# Patient Record
Sex: Male | Born: 1940 | Race: White | Hispanic: No | Marital: Married | Smoking: Current every day smoker
Health system: Southern US, Community
[De-identification: ages and names within clinical notes are randomized; demographics above are authoritative.]

## PROBLEM LIST (undated history)

## (undated) DIAGNOSIS — I10 Essential (primary) hypertension: Secondary | ICD-10-CM

## (undated) DIAGNOSIS — W3400XA Accidental discharge from unspecified firearms or gun, initial encounter: Secondary | ICD-10-CM

## (undated) DIAGNOSIS — R569 Unspecified convulsions: Secondary | ICD-10-CM

## (undated) DIAGNOSIS — E785 Hyperlipidemia, unspecified: Secondary | ICD-10-CM

## (undated) DIAGNOSIS — J449 Chronic obstructive pulmonary disease, unspecified: Secondary | ICD-10-CM

## (undated) DIAGNOSIS — K219 Gastro-esophageal reflux disease without esophagitis: Secondary | ICD-10-CM

## (undated) DIAGNOSIS — F32A Depression, unspecified: Secondary | ICD-10-CM

## (undated) DIAGNOSIS — I639 Cerebral infarction, unspecified: Secondary | ICD-10-CM

## (undated) DIAGNOSIS — F329 Major depressive disorder, single episode, unspecified: Secondary | ICD-10-CM

## (undated) HISTORY — PX: APPENDECTOMY: SHX54

## (undated) HISTORY — PX: LAMINECTOMY: SHX219

## (undated) HISTORY — PX: BACK SURGERY: SHX140

---

## 2005-11-05 ENCOUNTER — Observation Stay: Payer: Self-pay | Admitting: General Surgery

## 2005-11-05 ENCOUNTER — Other Ambulatory Visit: Payer: Self-pay

## 2008-05-25 ENCOUNTER — Ambulatory Visit: Payer: Self-pay | Admitting: Internal Medicine

## 2008-07-06 ENCOUNTER — Ambulatory Visit: Payer: Self-pay | Admitting: Internal Medicine

## 2008-07-27 ENCOUNTER — Ambulatory Visit: Payer: Self-pay | Admitting: Family Medicine

## 2008-09-06 ENCOUNTER — Emergency Department: Payer: Self-pay | Admitting: Emergency Medicine

## 2008-10-08 ENCOUNTER — Emergency Department: Payer: Self-pay | Admitting: Emergency Medicine

## 2010-06-25 ENCOUNTER — Inpatient Hospital Stay: Payer: Self-pay | Admitting: *Deleted

## 2011-08-24 ENCOUNTER — Observation Stay: Payer: Self-pay | Admitting: Internal Medicine

## 2012-05-07 ENCOUNTER — Emergency Department: Payer: Self-pay | Admitting: Emergency Medicine

## 2012-05-07 LAB — CBC
HCT: 41.4 % (ref 40.0–52.0)
HGB: 13.7 g/dL (ref 13.0–18.0)
MCH: 30.7 pg (ref 26.0–34.0)
MCHC: 33.2 g/dL (ref 32.0–36.0)
Platelet: 191 10*3/uL (ref 150–440)
RBC: 4.48 10*6/uL (ref 4.40–5.90)

## 2012-05-07 LAB — COMPREHENSIVE METABOLIC PANEL
Albumin: 4.1 g/dL (ref 3.4–5.0)
Alkaline Phosphatase: 57 U/L (ref 50–136)
Anion Gap: 5 — ABNORMAL LOW (ref 7–16)
Bilirubin,Total: 0.2 mg/dL (ref 0.2–1.0)
Chloride: 102 mmol/L (ref 98–107)
Co2: 32 mmol/L (ref 21–32)
Potassium: 4.8 mmol/L (ref 3.5–5.1)
SGOT(AST): 29 U/L (ref 15–37)
SGPT (ALT): 24 U/L
Sodium: 139 mmol/L (ref 136–145)
Total Protein: 7.6 g/dL (ref 6.4–8.2)

## 2012-05-07 LAB — URINALYSIS, COMPLETE
Bacteria: NONE SEEN
Bilirubin,UR: NEGATIVE
Glucose,UR: NEGATIVE mg/dL (ref 0–75)
Ketone: NEGATIVE
Nitrite: NEGATIVE
Ph: 7 (ref 4.5–8.0)
RBC,UR: NONE SEEN /HPF (ref 0–5)
Specific Gravity: 1.006 (ref 1.003–1.030)

## 2012-05-07 LAB — PHENYTOIN LEVEL, TOTAL: Dilantin: 13.7 ug/mL (ref 10.0–20.0)

## 2013-07-18 ENCOUNTER — Inpatient Hospital Stay: Payer: Self-pay | Admitting: Internal Medicine

## 2013-07-18 LAB — BASIC METABOLIC PANEL
Anion Gap: 1 — ABNORMAL LOW (ref 7–16)
BUN: 21 mg/dL — ABNORMAL HIGH (ref 7–18)
Calcium, Total: 9 mg/dL (ref 8.5–10.1)
Creatinine: 1.11 mg/dL (ref 0.60–1.30)
EGFR (African American): >60
EGFR (Non-African Amer.): >60
Glucose: 91 mg/dL (ref 65–99)
Osmolality: 275 (ref 275–301)
Potassium: 4.4 mmol/L (ref 3.5–5.1)
Sodium: 136 mmol/L (ref 136–145)

## 2013-07-18 LAB — CBC
HCT: 38.9 % — ABNORMAL LOW (ref 40.0–52.0)
HGB: 13.3 g/dL (ref 13.0–18.0)
MCH: 30.6 pg (ref 26.0–34.0)
MCV: 89 fL (ref 80–100)
Platelet: 216 10*3/uL (ref 150–440)
RBC: 4.35 10*6/uL — ABNORMAL LOW (ref 4.40–5.90)
WBC: 7.5 10*3/uL (ref 3.8–10.6)

## 2013-07-18 LAB — CK TOTAL AND CKMB (NOT AT ARMC)
CK, Total: 87 U/L (ref 35–232)
CK-MB: 1.3 ng/mL (ref 0.5–3.6)

## 2013-07-18 LAB — PROTIME-INR
INR: 0.9
Prothrombin Time: 12.8 secs (ref 11.5–14.7)

## 2013-07-18 LAB — TROPONIN I: Troponin-I: <0.02 ng/mL

## 2013-07-19 LAB — TROPONIN I: Troponin-I: <0.02 ng/mL

## 2013-10-22 ENCOUNTER — Observation Stay: Payer: Self-pay | Admitting: Internal Medicine

## 2013-10-22 LAB — CBC
HCT: 39.7 % — ABNORMAL LOW (ref 40.0–52.0)
HGB: 13.5 g/dL (ref 13.0–18.0)
RBC: 4.43 10*6/uL (ref 4.40–5.90)
RDW: 14.2 % (ref 11.5–14.5)
WBC: 7.8 10*3/uL (ref 3.8–10.6)

## 2013-10-22 LAB — COMPREHENSIVE METABOLIC PANEL
Albumin: 3.7 g/dL (ref 3.4–5.0)
Anion Gap: 0 — ABNORMAL LOW (ref 7–16)
BUN: 19 mg/dL — ABNORMAL HIGH (ref 7–18)
Calcium, Total: 9.4 mg/dL (ref 8.5–10.1)
Creatinine: 1.11 mg/dL (ref 0.60–1.30)
EGFR (African American): >60
EGFR (Non-African Amer.): >60
Glucose: 100 mg/dL — ABNORMAL HIGH (ref 65–99)
Osmolality: 278 (ref 275–301)
SGOT(AST): 30 U/L (ref 15–37)
Total Protein: 7.6 g/dL (ref 6.4–8.2)

## 2013-10-22 LAB — URINALYSIS, COMPLETE
Bilirubin,UR: NEGATIVE
Blood: NEGATIVE
Glucose,UR: NEGATIVE mg/dL (ref 0–75)
Ketone: NEGATIVE
Leukocyte Esterase: NEGATIVE
Nitrite: NEGATIVE
Ph: 6 (ref 4.5–8.0)
Protein: NEGATIVE
RBC,UR: 1 /HPF (ref 0–5)
Specific Gravity: 1.02 (ref 1.003–1.030)
WBC UR: 1 /HPF (ref 0–5)

## 2013-10-22 LAB — PROTIME-INR: Prothrombin Time: 13.3 secs (ref 11.5–14.7)

## 2013-10-22 LAB — CK TOTAL AND CKMB (NOT AT ARMC): CK-MB: 2 ng/mL (ref 0.5–3.6)

## 2013-10-22 LAB — TROPONIN I: Troponin-I: <0.02 ng/mL

## 2013-10-23 LAB — CBC WITH DIFFERENTIAL/PLATELET
Basophil #: 0 10*3/uL (ref 0.0–0.1)
HCT: 36.5 % — ABNORMAL LOW (ref 40.0–52.0)
Lymphocyte #: 1.4 10*3/uL (ref 1.0–3.6)
Lymphocyte %: 20.7 %
MCH: 30.5 pg (ref 26.0–34.0)
MCHC: 34.3 g/dL (ref 32.0–36.0)
MCV: 89 fL (ref 80–100)
Monocyte #: 0.8 x10 3/mm (ref 0.2–1.0)
Monocyte %: 12.3 %
Neutrophil %: 64.1 %
Platelet: 286 10*3/uL (ref 150–440)
RDW: 13.7 % (ref 11.5–14.5)
WBC: 6.6 10*3/uL (ref 3.8–10.6)

## 2013-10-23 LAB — BASIC METABOLIC PANEL
Chloride: 103 mmol/L (ref 98–107)
Co2: 30 mmol/L (ref 21–32)
Creatinine: 1.05 mg/dL (ref 0.60–1.30)
EGFR (African American): >60
EGFR (Non-African Amer.): >60
Potassium: 4 mmol/L (ref 3.5–5.1)

## 2014-11-06 ENCOUNTER — Ambulatory Visit: Payer: Self-pay | Admitting: Family Medicine
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
## 2015-03-26 NOTE — Discharge Summary (Signed)
PATIENT NAME:  Jamie Parsons, Jamie Parsons MR#:  [id number] DATE OF BIRTH:  04-26-1941  DATE OF ADMISSION:  07/18/2013 DATE OF DISCHARGE:  07/21/2013  PRIMARY CARE PHYSICIAN:  Barry BrunnerGlenn Willett, MD.  DISCHARGE DIAGNOSES:  1.  Acute exacerbation of chronic obstructive pulmonary disease.  2.  Chest pain.  3.  Hypertension.  4.  Tobacco abuse.   CONDITION: Stable.   CODE STATUS: FULL CODE.   HOME MEDICATIONS: [facility] and medication reconciliation list.   The patient needs home health.   DIET: Low sodium diet.   ACTIVITY: As tolerated.   FOLLOW-UP CARE: Follow with PCP within 1 to 2 weeks.   REASON FOR ADMISSION: Shortness of breath and recurrent chest pain.   HOSPITAL COURSE: The patient is a 74 year old Caucasian male with a history of COPD who  presented to the ED with shortness of breath and recurrent chest pain. The patient is not taking any inhalers. He is a chronic smoker.  For a detailed history and physical examination, please refer to the admission note dictated by Dr. Rudene Rearwish.   1.  After admission, the patient has been treated with DuoNebs q.4 hours p.r.n. along with Solu-Medrol and oxygen by nasal cannula. Since the patient's symptoms improved after above-mentioned treatment, the patient's IV steroid was discontinued and then restarted the prednisone. 2.  Oxygen was weaned off today.  3.  For recurrent left-sided chest pain, the patient's troponins were negative for three sets. The patient has been treated with aspirin, Plavix, statin. The   patient needs a stress test as an outpatient.  4.  Tobacco abuse. The patient was counseled for smoking cessation.  5.  Hypertension. Has been treated with hypertension medication.  6.  The patient received physical therapy evaluation and they suggested that the patient needs a facility; however, the patient wants to go home and then he would consider some facility placement.   I discussed the  patient's discharge plan with the patient, the case manager, and nurse.   TIME SPENT: About 38 minutes.    ____________________________  Shaune PollackQing Geron Mulford, MD qc:np D: 07/21/2013 15:08:57 ET T: 07/21/2013 17:31:37 ET JOB#: [id number]  cc: Shaune PollackQing Devaris Quirk, MD, <Dictator> Shaune PollackQING Kaydin Karbowski MD ELECTRONICALLY SIGNED 07/22/2013 14:31
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
## 2015-09-14 ENCOUNTER — Other Ambulatory Visit: Payer: Self-pay | Admitting: Family Medicine

## 2015-09-14 DIAGNOSIS — R911 Solitary pulmonary nodule: Secondary | ICD-10-CM

## 2015-09-15 ENCOUNTER — Emergency Department: Payer: PPO

## 2015-09-15 ENCOUNTER — Encounter: Payer: Self-pay | Admitting: *Deleted

## 2015-09-15 ENCOUNTER — Emergency Department
Admission: EM | Admit: 2015-09-15 | Discharge: 2015-09-15 | Disposition: A | Discharge status: Home / Self Care | Payer: PPO | Attending: Emergency Medicine | Admitting: Emergency Medicine

## 2015-09-15 DIAGNOSIS — F32A Depression, unspecified: Secondary | ICD-10-CM

## 2015-09-15 DIAGNOSIS — R531 Weakness: Secondary | ICD-10-CM | POA: Diagnosis present

## 2015-09-15 DIAGNOSIS — F4321 Adjustment disorder with depressed mood: Secondary | ICD-10-CM

## 2015-09-15 DIAGNOSIS — M6281 Muscle weakness (generalized): Secondary | ICD-10-CM | POA: Diagnosis not present

## 2015-09-15 DIAGNOSIS — F329 Major depressive disorder, single episode, unspecified: Secondary | ICD-10-CM | POA: Insufficient documentation

## 2015-09-15 DIAGNOSIS — Z8659 Personal history of other mental and behavioral disorders: Secondary | ICD-10-CM

## 2015-09-15 DIAGNOSIS — F1011 Alcohol abuse, in remission: Secondary | ICD-10-CM

## 2015-09-15 HISTORY — DX: Chronic obstructive pulmonary disease, unspecified: J44.9

## 2015-09-15 HISTORY — DX: Cerebral infarction, unspecified: I63.9

## 2015-09-15 HISTORY — DX: Unspecified convulsions: R56.9

## 2015-09-15 LAB — COMPREHENSIVE METABOLIC PANEL
ALT: 24 U/L (ref 17–63)
AST: 26 U/L (ref 15–41)
Albumin: 4.1 g/dL (ref 3.5–5.0)
Alkaline Phosphatase: 45 U/L (ref 38–126)
Anion gap: 5 (ref 5–15)
BUN: 17 mg/dL (ref 6–20)
CHLORIDE: 101 mmol/L (ref 101–111)
CO2: 33 mmol/L — AB (ref 22–32)
CREATININE: 1.21 mg/dL (ref 0.61–1.24)
Calcium: 9 mg/dL (ref 8.9–10.3)
GFR calc Af Amer: >60 mL/min (ref >60)
GFR calc non Af Amer: 56 mL/min — ABNORMAL LOW (ref >60)
Glucose, Bld: 82 mg/dL (ref 65–99)
POTASSIUM: 4.6 mmol/L (ref 3.5–5.1)
SODIUM: 139 mmol/L (ref 135–145)
Total Bilirubin: 0.4 mg/dL (ref 0.3–1.2)
Total Protein: 6.9 g/dL (ref 6.5–8.1)

## 2015-09-15 LAB — CBC WITH DIFFERENTIAL/PLATELET
BASOS ABS: 0 10*3/uL (ref 0–0.1)
BASOS PCT: 0 %
EOS ABS: 0.1 10*3/uL (ref 0–0.7)
EOS PCT: 1 %
HCT: 39.6 % — ABNORMAL LOW (ref 40.0–52.0)
Hemoglobin: 13.5 g/dL (ref 13.0–18.0)
LYMPHS PCT: 23 %
Lymphs Abs: 1.2 10*3/uL (ref 1.0–3.6)
MCH: 30.8 pg (ref 26.0–34.0)
MCHC: 34 g/dL (ref 32.0–36.0)
MCV: 90.8 fL (ref 80.0–100.0)
MONO ABS: 0.6 10*3/uL (ref 0.2–1.0)
Monocytes Relative: 12 %
Neutro Abs: 3.4 10*3/uL (ref 1.4–6.5)
Neutrophils Relative %: 64 %
PLATELETS: 214 10*3/uL (ref 150–440)
RBC: 4.36 MIL/uL — AB (ref 4.40–5.90)
RDW: 14.2 % (ref 11.5–14.5)
WBC: 5.4 10*3/uL (ref 3.8–10.6)

## 2015-09-15 LAB — APTT: APTT: 28 s (ref 24–36)

## 2015-09-15 LAB — PHENYTOIN LEVEL, TOTAL: Phenytoin Lvl: 14.8 ug/mL (ref 10.0–20.0)

## 2015-09-15 LAB — ACETAMINOPHEN LEVEL

## 2015-09-15 LAB — PROTIME-INR
INR: 1
PROTHROMBIN TIME: 13.4 s (ref 11.4–15.0)

## 2015-09-15 LAB — TROPONIN I

## 2015-09-15 LAB — SALICYLATE LEVEL

## 2015-09-15 MED ORDER — SODIUM CHLORIDE 0.9 % IV BOLUS (SEPSIS)
500.0000 mL | Freq: Once | INTRAVENOUS | Status: AC
  Administered 2015-09-15: 500 mL via INTRAVENOUS

## 2015-09-15 NOTE — ED Notes (Signed)
Patient transported to X-ray 

## 2015-09-15 NOTE — ED Notes (Signed)
Psych MD at bedside

## 2015-09-15 NOTE — ED Provider Notes (Addendum)
[facility] Emergency Department Provider Note  ____________________________________________   I have reviewed the triage vital signs and the nursing notes.   HISTORY  Chief Complaint Weakness    HPI Demonte Dobratz. is a 74 y.o. male presents today stating he feels "all right". Patient has a history of a CVA with dysarthria and left-sided weakness, he states that he came in today because he was feeling generally weak. He also states that he has been crying recently is not sure why. He has had no SI or HI. His family is not currently at bedside. Patient has no other complaints. He states he thinks he may have had other minor strokes over the last couple weeks and sometimes he has difficulty walking or talking but that is not the case at this moment. History is limited by patient's history was somewhat vague although he is awake and alert  Past Medical History  Diagnosis Date  . Stroke (HCC)   . Seizures (HCC)   . COPD (chronic obstructive pulmonary disease) (HCC)     There are no active problems to display for this patient.   No past surgical history on file.  No current outpatient prescriptions on file.  Allergies Review of patient's allergies indicates no known allergies.  No family history on file.  Social History Social History  Substance Use Topics  . Smoking status: Never Smoker   . Smokeless tobacco: None  . Alcohol Use: No    Review of Systems Constitutional: No fever/chills Eyes: No visual changes. ENT: No sore throat. No stiff neck no neck pain Cardiovascular: Denies chest pain. Respiratory: Denies shortness of breath. Gastrointestinal:   no vomiting.  No diarrhea.  No constipation. Genitourinary: Negative for dysuria. Musculoskeletal: Negative lower extremity swelling Skin: Negative for rash. Neurological: Negative for headaches, focal weakness or numbness. 10-point ROS otherwise  negative.  ____________________________________________   PHYSICAL EXAM:  VITAL SIGNS: ED Triage Vitals  Enc Vitals Group     BP 09/15/15 1404 98/83 mmHg     Pulse Rate 09/15/15 1404 77     Resp 09/15/15 1404 16     Temp 09/15/15 1404 98.2 F (36.8 C)     Temp src --      SpO2 09/15/15 1404 100 %     Weight --      Height --      Head Cir --      Peak Flow --      Pain Score --      Pain Loc --      Pain Edu? --      Excl. in GC? --     Constitutional: Alert and oriented to name and place and year unsure of the date Eyes: Conjunctivae are normal. PERRL. EOMI., patient is blind in his right eye and has a divergent gaze which is baseline Head: Atraumatic. Nose: No congestion/rhinnorhea. Mouth/Throat: Mucous membranes are moist.  Oropharynx non-erythematous. Neck: No stridor.   Nontender with no meningismus Cardiovascular: Normal rate, regular rhythm. Grossly normal heart sounds.  Good peripheral circulation. Respiratory: Normal respiratory effort.  No retractions. Lungs CTAB. Gastrointestinal: Soft and nontender. No distention. No guarding no rebound Back:  There is no focal tenderness or step off there is no midline tenderness there are no lesions noted. there is no CVA tenderness Musculoskeletal: No lower extremity tenderness. No joint effusions, no DVT signs strong distal pulses no edema Neurologic:  Normal speech and language. No gross focal neurologic deficits are appreciated. Patient  has a nonfocal neurologic exam at this time Skin:  Skin is warm, dry and intact. No rash noted. Psychiatric: Mood and affect are normal. Speech and behavior are normal.  ____________________________________________   LABS (all labs ordered are listed, but only abnormal results are displayed)  Labs Reviewed  URINALYSIS COMPLETEWITH MICROSCOPIC ([facility] ONLY)  TROPONIN I  CBC WITH DIFFERENTIAL/PLATELET  COMPREHENSIVE METABOLIC PANEL  PROTIME-INR    ____________________________________________  EKG I have interpreted EKG, wandering baseline inhibits results to some extent, patient however will not sit still for a repeat EKG. Normal sinus rhythm rate 69 bpm no acute ST elevation or depression normal axis specific ST changes  ____________________________________________  RADIOLOGY   ____________________________________________   PROCEDURES  Procedure(s) performed: None  Critical Care performed: None  ____________________________________________   INITIAL IMPRESSION / ASSESSMENT AND PLAN / ED COURSE  Pertinent labs & imaging results that were available during my care of the patient were reviewed by me and considered in my medical decision making (see chart for details).  Patient here with some vague complaints of feeling generally unwell. Obviously a patient his age this could be many different things. We will institute a broad workup including CT of the head and we will continue to monitor him here. His pressure was initially 98 over palp very rapidly came up to 108, we will give him hydration as he appears somewhat dehydrated and will reassess.   ----------------------------------------- 3:25 PM on 09/15/2015 -----------------------------------------  Family at bedside and apparently has a history of depression and has been depressed over the last several weeks and that is why he was sent in. Patient states he has been somewhat depressed but has no SI. Unfortunately he does have a history of a suicide attempt 50 years ago. He does also have guns in the house although he does not at this time stated he is a danger to himself or others we will have psychiatry see him. Patient states she feels physically at his baseline at this time although he was a little lightheaded earlier today he feels better after the fluids, and his family state that he is at his baseline   ____________________________________________  ----------------------------------------- 3:39 PM on 09/15/2015 -----------------------------------------  Signed out at the end of my shift to oncoming physician  FINAL CLINICAL IMPRESSION(S) / ED DIAGNOSES  Final diagnoses:  None     Jeanmarie PlantJames A Armella Stogner, MD 09/15/15 1438  Jeanmarie PlantJames A Leisa Gault, MD 09/15/15 1526  Jeanmarie PlantJames A Arbadella Kimbler, MD 09/15/15 1539  Jeanmarie PlantJames A Kwinton Maahs, MD 09/15/15 [phone]
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
## 2015-09-15 NOTE — ED Notes (Signed)
Pt given urinal to obtain specimen, but states unable to go. Refusing to have in and out catheter. MD made aware.

## 2015-09-15 NOTE — BH Assessment (Signed)
Assessment Note  Jamie CellaDavid James Klebba Jr. is an 74 y.o. male who reports to the ER for possible symptoms of weakness.  Pt has a "mini stroke" a couple of months ago and called EMS today because he noticed his left side of his face was numb.  Pt has reported being very tearful recently due being stressed about his health, his wife's declining health and his inability to do activities he once could do.  Pt was oriented x4, thought process was coherent and relevant. Pt denies SI, HI, A/V H. Pt exhibits mild symptoms of depression due to his current stressors.   Diagnosis: Deferred  Past Medical History:  Past Medical History  Diagnosis Date  . Stroke (HCC)   . Seizures (HCC)   . COPD (chronic obstructive pulmonary disease) (HCC)     No past surgical history on file.  Family History: No family history on file.  Social History:  reports that he has never smoked. He does not have any smokeless tobacco history on file. He reports that he does not drink alcohol or use illicit drugs.  Additional Social History:  Alcohol / Drug Use Pain Medications: None reported Prescriptions: None reported Over the Counter: None reported History of alcohol / drug use?: No history of alcohol / drug abuse  CIWA: CIWA-Ar BP: 98/83 mmHg Pulse Rate: 77 COWS:    Allergies:  Allergies  Allergen Reactions  . Penicillins Other (See Comments)    Pt states that this medication "makes him crazy".   Has patient had a PCN reaction causing immediate rash, facial/tongue/throat swelling, SOB or lightheadedness with hypotension: No Has patient had a PCN reaction causing severe rash involving mucus membranes or skin necrosis: No Has patient had a PCN reaction that required hospitalization No Has patient had a PCN reaction occurring within the last 10 years: No If all of the above answers are "NO", then may proceed with Cephalosporin use.    Home Medications:  (Not in a hospital admission)  OB/GYN Status:  No LMP  for male patient.  General Assessment Data [facility] Assessment: In system Is this a Tele or Face-to-Face Assessment?: Face-to-Face Is this an Initial Assessment or a Re-assessment for this encounter?: Initial Assessment Marital status: Married Jamie VeraMaiden name: n/a Is patient pregnant?: No Pregnancy Status: No Living Arrangements: Spouse/significant other Can pt return to current living arrangement?: Yes Admission Status: Voluntary Is patient capable of signing voluntary admission?: Yes Referral Source: Self/Family/Friend Insurance type: Health Team Adv     Crisis Care Plan Living Arrangements: Spouse/significant other Name of Psychiatrist: N/a Name of Therapist: N/a  Education Status Is patient currently in school?: No Current Grade: 0 Highest grade of school patient has completed: 4211 Name of school: N/a  Risk to self with the past 6 months Suicidal Ideation: No Has patient been a risk to self within the past 6 months prior to admission? : No Suicidal Intent: No Has patient had any suicidal intent within the past 6 months prior to admission? : No Is patient at risk for suicide?: No Suicidal Plan?: No Has patient had any suicidal plan within the past 6 months prior to admission? : No Access to Means: No What has been your use of drugs/alcohol within the last 12 months?: None reported Previous Attempts/Gestures: No How many times?: 0 Other Self Harm Risks: 0 Triggers for Past Attempts: None known Intentional Self Injurious Behavior: None Family Suicide History: No Recent stressful life event(s): Trauma (Comment) (health issues, suffered a  massive stroke 2014) Persecutory voices/beliefs?: No Depression: No Depression Symptoms: Tearfulness Substance abuse history and/or treatment for substance abuse?: No Suicide prevention information given to non-admitted patients: Not applicable  Risk to Others within the past 6 months Homicidal Ideation:  No Does patient have any lifetime risk of violence toward others beyond the six months prior to admission? : No Thoughts of Harm to Others: No Current Homicidal Intent: No Current Homicidal Plan: No Access to Homicidal Means: No Identified Victim: none reported History of harm to others?: No Assessment of Violence: None Noted Violent Behavior Description: none noted Does patient have access to weapons?: No Criminal Charges Pending?: No Does patient have a court date: No Is patient on probation?: No  Psychosis Hallucinations: None noted Delusions: None noted  Mental Status Report Appearance/Hygiene: Unremarkable Eye Contact: Good Motor Activity: Unsteady Speech: Soft Level of Consciousness: Alert Mood: Fearful Affect: Appropriate to circumstance Anxiety Level: None Thought Processes: Coherent, Relevant Judgement: Unimpaired Orientation: Person, Place, Time, Situation, Appropriate for developmental age Obsessive Compulsive Thoughts/Behaviors: None  Cognitive Functioning Concentration: Normal Memory: Remote Impaired, Recent Intact IQ: Average Insight: Good Impulse Control: Good Appetite: Good Weight Loss: 0 Weight Gain: 0 Sleep: No Change Total Hours of Sleep: 6 Vegetative Symptoms: None  ADLScreening [facility] Assessment Services) Patient's cognitive ability adequate to safely complete daily activities?: Yes Patient able to express need for assistance with ADLs?: Yes Independently performs ADLs?: Yes (appropriate for developmental age)  Prior Inpatient Therapy Prior Inpatient Therapy: No  Prior Outpatient Therapy Prior Outpatient Therapy: No Does patient have an ACCT team?: No Does patient have Intensive In-House Services?  : No Does patient have [facility] services? : No Does patient have P4CC services?: No  ADL Screening (condition at time of admission) Patient's cognitive ability adequate to safely complete daily activities?: Yes Patient able to express need  for assistance with ADLs?: Yes Independently performs ADLs?: Yes (appropriate for developmental age)       Abuse/Neglect Assessment (Assessment to be complete while patient is alone) Physical Abuse: Denies Verbal Abuse: Denies Sexual Abuse: Denies Exploitation of patient/patient's resources: Denies Values / Beliefs Cultural Requests During Hospitalization: None Spiritual Requests During Hospitalization: None        Additional Information 1:1 In Past 12 Months?: No CIRT Risk: No Elopement Risk: No Does patient have medical clearance?: No     Disposition:  Disposition Initial Assessment Completed for this Encounter: Yes Disposition of Patient: Other dispositions Other disposition(s): Other (Comment) (No services needed)  On Site Evaluation by:   Reviewed with Physician:    Jamie Parsons 09/15/2015 5:25 PM

## 2015-09-15 NOTE — ED Notes (Signed)
Per EMS pt from home with c/o generalized weakness x 1 week. Hx of stroke 3 months ago, left side deficit, but nothing new discovered. Pt denies pain. VSS. IV 20G LFA started. CBG 105. EKG NSR.

## 2015-09-15 NOTE — ED Provider Notes (Addendum)
Signout from Dr. Alphonzo LemmingsMcShane to follow-up with patient's labs as well as imaging results.   Physical Exam  BP 98/83 mmHg  Pulse 77  Temp(Src) 98.2 F (36.8 C)  Resp 16  SpO2 100% ----------------------------------------- 6:15 PM on 09/15/2015 -----------------------------------------   Physical Exam Patient resting comfortable at this time without any focal weakness or numbness. He feels at his baseline. ED Course  Procedures  MDM Patient was seen and evaluated by Dr. Toni Amendlapacs who does not see the need to admit this patient to the hospital. The patient will be discharged and [facility]. Unclear cause of his generalized weakness however without any new findings on his CT scan and the symptoms have been going on for weeks. Also with reassuring lab workup. He will continue taking his Plavix at home as well as other at-home medications. He will be discharged. I explained the results as well as the plan with patient as well as his wife were understanding and willing to comply.      Myrna Blazeravid Matthew Shiquan Mathieu, MD 09/15/15 1817  Continues to deny any suicidal or homicidal ideation.  Myrna Blazeravid Matthew Morrie Daywalt, MD 09/15/15 [phone]

## 2015-09-15 NOTE — ED Notes (Signed)
Psychiatrist at bedside

## 2015-09-17 ENCOUNTER — Ambulatory Visit: Admission: RE | Admit: 2015-09-17 | Payer: PPO | Source: Ambulatory Visit

## 2016-03-21 DIAGNOSIS — E7800 Pure hypercholesterolemia, unspecified: Secondary | ICD-10-CM | POA: Diagnosis not present

## 2016-03-21 DIAGNOSIS — J418 Mixed simple and mucopurulent chronic bronchitis: Secondary | ICD-10-CM | POA: Diagnosis not present

## 2016-03-21 DIAGNOSIS — R739 Hyperglycemia, unspecified: Secondary | ICD-10-CM | POA: Diagnosis not present

## 2016-03-21 DIAGNOSIS — F3341 Major depressive disorder, recurrent, in partial remission: Secondary | ICD-10-CM | POA: Diagnosis not present

## 2016-03-21 DIAGNOSIS — G40909 Epilepsy, unspecified, not intractable, without status epilepticus: Secondary | ICD-10-CM | POA: Diagnosis not present

## 2016-03-21 DIAGNOSIS — R413 Other amnesia: Secondary | ICD-10-CM | POA: Diagnosis not present

## 2016-03-21 DIAGNOSIS — R911 Solitary pulmonary nodule: Secondary | ICD-10-CM | POA: Diagnosis not present

## 2016-03-21 DIAGNOSIS — I10 Essential (primary) hypertension: Secondary | ICD-10-CM | POA: Diagnosis not present

## 2016-03-22 DIAGNOSIS — E7800 Pure hypercholesterolemia, unspecified: Secondary | ICD-10-CM | POA: Diagnosis not present

## 2016-03-22 DIAGNOSIS — R739 Hyperglycemia, unspecified: Secondary | ICD-10-CM | POA: Diagnosis not present

## 2016-08-25 DIAGNOSIS — K219 Gastro-esophageal reflux disease without esophagitis: Secondary | ICD-10-CM | POA: Diagnosis not present

## 2016-08-25 DIAGNOSIS — E785 Hyperlipidemia, unspecified: Secondary | ICD-10-CM | POA: Diagnosis not present

## 2016-08-25 DIAGNOSIS — I2510 Atherosclerotic heart disease of native coronary artery without angina pectoris: Secondary | ICD-10-CM | POA: Diagnosis not present

## 2016-08-25 DIAGNOSIS — Z87891 Personal history of nicotine dependence: Secondary | ICD-10-CM | POA: Diagnosis not present

## 2016-08-25 DIAGNOSIS — R079 Chest pain, unspecified: Secondary | ICD-10-CM | POA: Diagnosis not present

## 2016-08-25 DIAGNOSIS — I2119 ST elevation (STEMI) myocardial infarction involving other coronary artery of inferior wall: Secondary | ICD-10-CM | POA: Diagnosis not present

## 2016-08-25 DIAGNOSIS — R413 Other amnesia: Secondary | ICD-10-CM | POA: Diagnosis not present

## 2016-08-25 DIAGNOSIS — I959 Hypotension, unspecified: Secondary | ICD-10-CM | POA: Diagnosis not present

## 2016-08-25 DIAGNOSIS — Z66 Do not resuscitate: Secondary | ICD-10-CM | POA: Diagnosis not present

## 2016-08-25 DIAGNOSIS — Z7902 Long term (current) use of antithrombotics/antiplatelets: Secondary | ICD-10-CM | POA: Diagnosis not present

## 2016-08-25 DIAGNOSIS — G40909 Epilepsy, unspecified, not intractable, without status epilepticus: Secondary | ICD-10-CM | POA: Diagnosis not present

## 2016-08-25 DIAGNOSIS — Z7951 Long term (current) use of inhaled steroids: Secondary | ICD-10-CM | POA: Diagnosis not present

## 2016-08-25 DIAGNOSIS — F3341 Major depressive disorder, recurrent, in partial remission: Secondary | ICD-10-CM | POA: Diagnosis not present

## 2016-08-25 DIAGNOSIS — J449 Chronic obstructive pulmonary disease, unspecified: Secondary | ICD-10-CM | POA: Diagnosis not present

## 2016-08-25 DIAGNOSIS — I319 Disease of pericardium, unspecified: Secondary | ICD-10-CM | POA: Diagnosis not present

## 2016-08-25 DIAGNOSIS — F324 Major depressive disorder, single episode, in partial remission: Secondary | ICD-10-CM | POA: Diagnosis not present

## 2016-08-25 DIAGNOSIS — I10 Essential (primary) hypertension: Secondary | ICD-10-CM | POA: Diagnosis not present

## 2016-08-25 DIAGNOSIS — Z8673 Personal history of transient ischemic attack (TIA), and cerebral infarction without residual deficits: Secondary | ICD-10-CM | POA: Diagnosis not present

## 2016-08-25 DIAGNOSIS — R0789 Other chest pain: Secondary | ICD-10-CM | POA: Diagnosis not present

## 2016-08-25 DIAGNOSIS — R918 Other nonspecific abnormal finding of lung field: Secondary | ICD-10-CM | POA: Diagnosis not present

## 2016-08-25 DIAGNOSIS — Z7901 Long term (current) use of anticoagulants: Secondary | ICD-10-CM | POA: Diagnosis not present

## 2016-08-26 DIAGNOSIS — I309 Acute pericarditis, unspecified: Secondary | ICD-10-CM | POA: Diagnosis not present

## 2016-08-26 DIAGNOSIS — R079 Chest pain, unspecified: Secondary | ICD-10-CM | POA: Diagnosis not present

## 2016-09-20 DIAGNOSIS — I7300 Raynaud's syndrome without gangrene: Secondary | ICD-10-CM | POA: Diagnosis not present

## 2016-09-20 DIAGNOSIS — E7800 Pure hypercholesterolemia, unspecified: Secondary | ICD-10-CM | POA: Diagnosis not present

## 2016-09-20 DIAGNOSIS — G40909 Epilepsy, unspecified, not intractable, without status epilepticus: Secondary | ICD-10-CM | POA: Diagnosis not present

## 2016-09-20 DIAGNOSIS — R413 Other amnesia: Secondary | ICD-10-CM | POA: Diagnosis not present

## 2016-09-20 DIAGNOSIS — F3341 Major depressive disorder, recurrent, in partial remission: Secondary | ICD-10-CM | POA: Diagnosis not present

## 2016-09-20 DIAGNOSIS — R739 Hyperglycemia, unspecified: Secondary | ICD-10-CM | POA: Diagnosis not present

## 2016-09-20 DIAGNOSIS — I10 Essential (primary) hypertension: Secondary | ICD-10-CM | POA: Diagnosis not present

## 2016-09-20 DIAGNOSIS — I309 Acute pericarditis, unspecified: Secondary | ICD-10-CM | POA: Diagnosis not present

## 2016-09-20 DIAGNOSIS — J418 Mixed simple and mucopurulent chronic bronchitis: Secondary | ICD-10-CM | POA: Diagnosis not present

## 2016-09-20 DIAGNOSIS — R202 Paresthesia of skin: Secondary | ICD-10-CM | POA: Diagnosis not present

## 2016-09-20 DIAGNOSIS — Z23 Encounter for immunization: Secondary | ICD-10-CM | POA: Diagnosis not present

## 2016-10-10 ENCOUNTER — Other Ambulatory Visit: Payer: Self-pay | Admitting: Pharmacist

## 2016-10-10 NOTE — Patient Outreach (Addendum)
[facility] Care Management  10/10/2016  Nicole CellaDavid James Mannis Jr. [birth year] [id number]  [facility] by Aggie Cosierrystal, RN with [employer] for patient assistance evaluation for Combivent (ipratropium/albuterol) inhaler.    Unsuccessful outreach attempt to patient, left a HIPAA compliant voicemail requesting a return call.   Plan:  Will attempt to reach patient again within the next week.   Tommye StandardKevin Antonisha Waskey, PharmD, [facility] Clinical Pharmacist [facility] [phone]  This is a late addendum entry for 10/10/16.  [facility] returned [facility] phone call [phone] on 10/10/16 and verified HIPAA details.  Explained purpose of call to patient that [employer] RN referred [facility] for medication assistance evaluation.    Patient reports that he used to take Combivent but his PCP, Dr Maryjane HurterFeldpausch, replaced it with ProAir HFA at recent office visit and patient reports it was more affordable.    Patient states that he has enough ProAir and Advair to last the remainder of the year.    Medication Assistance: Per review of [facility] MA-PDP preferred drug list, Combivent is Tier 4, $85/30 day supply co-pay, Advair and ProAir are Tier 3, $45/30 day supply co-pay.   A long acting anticholinergic medication such as Spiriva appears to be Tier 3.    Counseled patient [facility] Pharmacist would send this note to his PCP regarding medication coverage so he and his PCP can discuss if any changes are necessary to his inhalers.   Plan:  Will close pharmacy case.  Will route note to patient's PCP.  [facility] if new pharmacy needs arise.    Tommye StandardKevin Payne Garske, PharmD, [facility] Clinical Pharmacist [facility] [phone]

## 2016-10-11 ENCOUNTER — Encounter: Payer: Self-pay | Admitting: Pharmacist

## 2016-10-13 ENCOUNTER — Ambulatory Visit: Payer: PPO | Admitting: Pharmacist

## 2017-05-17 DIAGNOSIS — I10 Essential (primary) hypertension: Secondary | ICD-10-CM | POA: Diagnosis not present

## 2017-05-17 DIAGNOSIS — R739 Hyperglycemia, unspecified: Secondary | ICD-10-CM | POA: Diagnosis not present

## 2017-05-17 DIAGNOSIS — G40909 Epilepsy, unspecified, not intractable, without status epilepticus: Secondary | ICD-10-CM | POA: Diagnosis not present

## 2017-05-17 DIAGNOSIS — I7300 Raynaud's syndrome without gangrene: Secondary | ICD-10-CM | POA: Diagnosis not present

## 2017-05-17 DIAGNOSIS — R413 Other amnesia: Secondary | ICD-10-CM | POA: Diagnosis not present

## 2017-05-17 DIAGNOSIS — E7800 Pure hypercholesterolemia, unspecified: Secondary | ICD-10-CM | POA: Diagnosis not present

## 2017-05-17 DIAGNOSIS — F3341 Major depressive disorder, recurrent, in partial remission: Secondary | ICD-10-CM | POA: Diagnosis not present

## 2017-05-17 DIAGNOSIS — J418 Mixed simple and mucopurulent chronic bronchitis: Secondary | ICD-10-CM | POA: Diagnosis not present

## 2017-05-18 DIAGNOSIS — I7300 Raynaud's syndrome without gangrene: Secondary | ICD-10-CM | POA: Diagnosis not present

## 2017-05-18 DIAGNOSIS — E7800 Pure hypercholesterolemia, unspecified: Secondary | ICD-10-CM | POA: Diagnosis not present

## 2017-05-18 DIAGNOSIS — R739 Hyperglycemia, unspecified: Secondary | ICD-10-CM | POA: Diagnosis not present

## 2017-06-05 DIAGNOSIS — L989 Disorder of the skin and subcutaneous tissue, unspecified: Secondary | ICD-10-CM | POA: Diagnosis not present

## 2017-06-13 DIAGNOSIS — D692 Other nonthrombocytopenic purpura: Secondary | ICD-10-CM | POA: Diagnosis not present

## 2017-06-13 DIAGNOSIS — L570 Actinic keratosis: Secondary | ICD-10-CM | POA: Diagnosis not present

## 2017-06-13 DIAGNOSIS — L821 Other seborrheic keratosis: Secondary | ICD-10-CM | POA: Diagnosis not present

## 2017-07-10 ENCOUNTER — Encounter: Payer: Self-pay | Admitting: *Deleted

## 2017-07-10 ENCOUNTER — Ambulatory Visit
Admission: EM | Admit: 2017-07-10 | Discharge: 2017-07-10 | Disposition: A | Discharge status: Home / Self Care | Payer: PPO | Attending: Emergency Medicine | Admitting: Emergency Medicine

## 2017-07-10 DIAGNOSIS — M25511 Pain in right shoulder: Secondary | ICD-10-CM | POA: Diagnosis not present

## 2017-07-10 DIAGNOSIS — M542 Cervicalgia: Secondary | ICD-10-CM

## 2017-07-10 DIAGNOSIS — S161XXA Strain of muscle, fascia and tendon at neck level, initial encounter: Secondary | ICD-10-CM

## 2017-07-10 MED ORDER — KETOROLAC TROMETHAMINE 60 MG/2ML IM SOLN
30.0000 mg | Freq: Once | INTRAMUSCULAR | Status: AC
  Administered 2017-07-10: 30 mg via INTRAMUSCULAR

## 2017-07-10 MED ORDER — PREDNISONE 10 MG (21) PO TBPK: ORAL_TABLET | ORAL | 0 refills | Status: DC

## 2017-07-10 NOTE — ED Triage Notes (Signed)
Gradual onset right shoulder pain x3-5 days. Denies injury. Pain increases with movement and pt has decreased ROM in right arm.

## 2017-07-10 NOTE — Discharge Instructions (Signed)
You were given a shot of Toradol medication today to help with pain and inflammation. Recommend start Prednisone (steroid) medication this evening- take 6 tablets today and then decrease by 1 tablet each day until finished on day 6. Apply warm compresses to area for comfort. Follow-up with your PCP in 2 to 3 days if not improving.

## 2017-07-10 NOTE — ED Provider Notes (Signed)
[facility]    CSN: [id number] Arrival date & time: 07/10/17  1543     History   Chief Complaint Chief Complaint  Patient presents with  . Shoulder Pain    HPI Jamie Parsons. is a 76 y.o. male.   76 year old male presents with right sided neck and shoulder pain that started about 1 week ago. No distinct injury but has been helping care for his wife and may have strained a muscle. No previous injury to area. Denies any fever, stiffness or weakness. Pain does radiate occasionally to deltoid area of right arm. Has tried OTC topical arthritis medication, Tylenol and Motrin with minimal relief. Having difficulty sleeping due to pain. He does have a history of stroke, seizures and COPD and takes multiple medications for management.    The history is provided by the patient.    Past Medical History:  Diagnosis Date  . COPD (chronic obstructive pulmonary disease) (HCC)   . Seizures (HCC)   . Stroke [facility])     Patient Active Problem List   Diagnosis Date Noted  . Adjustment disorder with depressed mood 09/15/2015  . History of major depression 09/15/2015  . History of alcohol abuse 09/15/2015    Past Surgical History:  Procedure Laterality Date  . BACK SURGERY         Home Medications    Prior to Admission medications   Medication Sig Start Date End Date Taking? Authorizing Provider  amitriptyline (ELAVIL) 50 MG tablet Take 50-100 mg by mouth 2 (two) times daily. Pt takes one tablet in the afternoon and two at bedtime.   Yes [provider]  atenolol (TENORMIN) 25 MG tablet Take 25 mg by mouth daily.   Yes [provider]  acetaminophen (TYLENOL) 325 MG tablet Take 325-650 mg by mouth every 6 (six) hours as needed for mild pain or headache.    [provider]  clopidogrel (PLAVIX) 75 MG tablet Take 75 mg by mouth daily.    [provider]  donepezil (ARICEPT) 5 MG tablet Take 5 mg by mouth at bedtime.    [provider]  lisinopril (PRINIVIL,ZESTRIL) 10 MG tablet Take 10 mg by mouth daily.    [provider]  phenytoin (DILANTIN) 100 MG ER capsule Take 100 mg by mouth 3 (three) times daily.     [provider]  predniSONE (STERAPRED UNI-PAK 21 TAB) 10 MG (21) TBPK tablet Take 6 tabs by mouth daily for 1st day then decrease by 1 tablet each day until finished on day 6. 07/10/17   Santhosh Gulino, Ali Lowe, NP    Family History History reviewed. No pertinent family history.  Social History Social History  Substance Use Topics  . Smoking status: Never Smoker  . Smokeless tobacco: Never Used  . Alcohol use No     Allergies   Penicillins   Review of Systems Review of Systems  Constitutional: Negative for appetite change, chills, fatigue and fever.  Respiratory: Positive for shortness of breath (chronic) and wheezing.   Cardiovascular: Negative for chest pain.  Gastrointestinal: Negative for abdominal pain, nausea and vomiting.  Musculoskeletal: Positive for arthralgias, gait problem (residual from stroke), myalgias and neck pain. Negative for joint swelling and neck stiffness.  Skin: Negative for color change, rash and wound.  Neurological: Positive for seizures and weakness. Negative for syncope, light-headedness, numbness and headaches.     Physical Exam Triage Vital Signs ED Triage Vitals [07/10/17 1630]  Enc Vitals Group  BP 95/61     Pulse Rate (!) 104     Resp 16     Temp 98.5 F (36.9 C)     Temp Source Oral     SpO2 95 %     Weight 140 lb (63.5 kg)     Height 6\' 2"  (1.88 m)     Head Circumference      Peak Flow      Pain Score 9     Pain Loc      Pain Edu?      Excl. in GC?    No data found.   Updated Vital Signs BP 95/61 (BP Location: Left Arm)   Pulse (!) 104   Temp 98.5 F (36.9 C) (Oral)   Resp 16   Ht 6\' 2"  (1.88 m)   Wt 140 lb (63.5 kg)   SpO2 95%   BMI 17.97 kg/m   Visual Acuity Right Eye Distance:   Left Eye Distance:     Bilateral Distance:    Right Eye Near:   Left Eye Near:    Bilateral Near:     Physical Exam  Constitutional: He is oriented to person, place, and time. He appears well-developed and well-nourished. No distress.  HENT:  Head: Normocephalic and atraumatic.  Eyes: Conjunctivae and EOM are normal.  Neck: Normal range of motion. Neck supple. Muscular tenderness present. No neck rigidity.    Cardiovascular: Regular rhythm.  Tachycardia present.   Pulmonary/Chest: Accessory muscle usage present. No respiratory distress. He has wheezes in the right upper field, the right lower field, the left upper field and the left lower field.  Musculoskeletal: He exhibits tenderness. He exhibits no deformity.       Right shoulder: He exhibits tenderness, pain and spasm. He exhibits no swelling, no effusion, no crepitus, no deformity, normal pulse and normal strength.  Has decreased range of motion of right shoulder- particularly with abduction. Tender along trapezius muscle and paraspinous muscle group. Muscle spasms present. Has full range of motion of neck with minimal pain. Good distal pulses and no neuro deficits detected in right arm. Gait unsteady but normal for patient.   Neurological: He is alert and oriented to person, place, and time. He displays atrophy. He exhibits abnormal muscle tone.  Skin: Skin is warm and dry. Capillary refill takes less than 2 seconds. No rash noted. No erythema.  Psychiatric: He has a normal mood and affect. His behavior is normal.     UC Treatments / Results  Labs (all labs ordered are listed, but only abnormal results are displayed) Labs Reviewed - No data to display  EKG  EKG Interpretation None       Radiology No results found.  Procedures Procedures (including critical care time)  Medications Ordered in UC Medications  ketorolac (TORADOL) injection 30 mg (30 mg Intramuscular Given 07/10/17 1713)     Initial Impression / Assessment and Plan / UC  Course  I have reviewed the triage vital signs and the nursing notes.  Pertinent labs & imaging results that were available during my care of the patient were reviewed by me and considered in my medical decision making (see chart for details).   Discussed with patient that he appears to have strained his neck muscle that is also causing shoulder pain. Gave Toradol 30mg  IM now to help with pain and inflammation. Discussed that he is having muscle spasms but unable to prescribe muscle relaxers due to concurrent medications and side effects. Recommend apply  warm compresses to area for comfort. Start Prednisone 10mg  6 days dose pack as directed. May need referral to Orthopedic. Recommend follow-up with his PCP in 2 to 3 days if not improving.   Final Clinical Impressions(s) / UC Diagnoses   Final diagnoses:  Acute pain of right shoulder  Strain of neck muscle, initial encounter    New Prescriptions Discharge Medication List as of 07/10/2017  5:25 PM    START taking these medications   Details  predniSONE (STERAPRED UNI-PAK 21 TAB) 10 MG (21) TBPK tablet Take 6 tabs by mouth daily for 1st day then decrease by 1 tablet each day until finished on day 6., Normal         Controlled Substance Prescriptions  Controlled Substance Registry consulted? No   Sudie Grumbling, NP 07/10/17 2314

## 2017-12-31 ENCOUNTER — Inpatient Hospital Stay
Admission: EM | Admit: 2017-12-31 | Discharge: 2018-01-02 | DRG: 542 | Disposition: A | Discharge status: Home Health | Payer: PPO | Attending: Internal Medicine | Admitting: Internal Medicine

## 2017-12-31 ENCOUNTER — Emergency Department: Payer: PPO

## 2017-12-31 ENCOUNTER — Other Ambulatory Visit: Payer: Self-pay

## 2017-12-31 DIAGNOSIS — J449 Chronic obstructive pulmonary disease, unspecified: Secondary | ICD-10-CM | POA: Diagnosis not present

## 2017-12-31 DIAGNOSIS — Z66 Do not resuscitate: Secondary | ICD-10-CM | POA: Diagnosis not present

## 2017-12-31 DIAGNOSIS — M4856XA Collapsed vertebra, not elsewhere classified, lumbar region, initial encounter for fracture: Secondary | ICD-10-CM | POA: Diagnosis not present

## 2017-12-31 DIAGNOSIS — B961 Klebsiella pneumoniae [K. pneumoniae] as the cause of diseases classified elsewhere: Secondary | ICD-10-CM | POA: Diagnosis present

## 2017-12-31 DIAGNOSIS — Z7902 Long term (current) use of antithrombotics/antiplatelets: Secondary | ICD-10-CM

## 2017-12-31 DIAGNOSIS — S79911A Unspecified injury of right hip, initial encounter: Secondary | ICD-10-CM | POA: Diagnosis not present

## 2017-12-31 DIAGNOSIS — N179 Acute kidney failure, unspecified: Secondary | ICD-10-CM | POA: Diagnosis not present

## 2017-12-31 DIAGNOSIS — S0990XA Unspecified injury of head, initial encounter: Secondary | ICD-10-CM | POA: Diagnosis not present

## 2017-12-31 DIAGNOSIS — I10 Essential (primary) hypertension: Secondary | ICD-10-CM | POA: Diagnosis present

## 2017-12-31 DIAGNOSIS — Z8673 Personal history of transient ischemic attack (TIA), and cerebral infarction without residual deficits: Secondary | ICD-10-CM | POA: Diagnosis not present

## 2017-12-31 DIAGNOSIS — R531 Weakness: Secondary | ICD-10-CM | POA: Diagnosis not present

## 2017-12-31 DIAGNOSIS — Z681 Body mass index (BMI) 19 or less, adult: Secondary | ICD-10-CM

## 2017-12-31 DIAGNOSIS — R42 Dizziness and giddiness: Secondary | ICD-10-CM | POA: Diagnosis not present

## 2017-12-31 DIAGNOSIS — E43 Unspecified severe protein-calorie malnutrition: Secondary | ICD-10-CM | POA: Diagnosis not present

## 2017-12-31 DIAGNOSIS — M545 Low back pain: Secondary | ICD-10-CM | POA: Diagnosis not present

## 2017-12-31 DIAGNOSIS — N390 Urinary tract infection, site not specified: Secondary | ICD-10-CM | POA: Diagnosis present

## 2017-12-31 DIAGNOSIS — S3992XA Unspecified injury of lower back, initial encounter: Secondary | ICD-10-CM | POA: Diagnosis not present

## 2017-12-31 DIAGNOSIS — M25559 Pain in unspecified hip: Secondary | ICD-10-CM | POA: Diagnosis not present

## 2017-12-31 DIAGNOSIS — W19XXXA Unspecified fall, initial encounter: Secondary | ICD-10-CM | POA: Diagnosis present

## 2017-12-31 DIAGNOSIS — M5136 Other intervertebral disc degeneration, lumbar region: Secondary | ICD-10-CM | POA: Diagnosis not present

## 2017-12-31 DIAGNOSIS — Z79899 Other long term (current) drug therapy: Secondary | ICD-10-CM

## 2017-12-31 DIAGNOSIS — S299XXA Unspecified injury of thorax, initial encounter: Secondary | ICD-10-CM | POA: Diagnosis not present

## 2017-12-31 DIAGNOSIS — M25551 Pain in right hip: Secondary | ICD-10-CM | POA: Diagnosis not present

## 2017-12-31 DIAGNOSIS — E860 Dehydration: Secondary | ICD-10-CM | POA: Diagnosis present

## 2017-12-31 DIAGNOSIS — M549 Dorsalgia, unspecified: Secondary | ICD-10-CM | POA: Diagnosis not present

## 2017-12-31 DIAGNOSIS — S32020A Wedge compression fracture of second lumbar vertebra, initial encounter for closed fracture: Secondary | ICD-10-CM

## 2017-12-31 LAB — CBC
HCT: 35.6 % — ABNORMAL LOW (ref 40.0–52.0)
Hemoglobin: 12.1 g/dL — ABNORMAL LOW (ref 13.0–18.0)
MCH: 31 pg (ref 26.0–34.0)
MCHC: 33.9 g/dL (ref 32.0–36.0)
MCV: 91.3 fL (ref 80.0–100.0)
PLATELETS: 221 10*3/uL (ref 150–440)
RBC: 3.9 MIL/uL — ABNORMAL LOW (ref 4.40–5.90)
RDW: 13.7 % (ref 11.5–14.5)
WBC: 9.3 10*3/uL (ref 3.8–10.6)

## 2017-12-31 LAB — TROPONIN I

## 2017-12-31 LAB — BASIC METABOLIC PANEL
Anion gap: 8 (ref 5–15)
BUN: 24 mg/dL — AB (ref 6–20)
CALCIUM: 8.8 mg/dL — AB (ref 8.9–10.3)
CHLORIDE: 99 mmol/L — AB (ref 101–111)
CO2: 29 mmol/L (ref 22–32)
CREATININE: 1.38 mg/dL — AB (ref 0.61–1.24)
GFR calc Af Amer: 55 mL/min — ABNORMAL LOW (ref >60)
GFR calc non Af Amer: 47 mL/min — ABNORMAL LOW (ref >60)
Glucose, Bld: 101 mg/dL — ABNORMAL HIGH (ref 65–99)
Potassium: 4.2 mmol/L (ref 3.5–5.1)
Sodium: 136 mmol/L (ref 135–145)

## 2017-12-31 LAB — URINALYSIS, COMPLETE (UACMP) WITH MICROSCOPIC
Bilirubin Urine: NEGATIVE
Glucose, UA: NEGATIVE mg/dL
Hgb urine dipstick: NEGATIVE
Ketones, ur: 5 mg/dL — AB
Nitrite: NEGATIVE
Protein, ur: NEGATIVE mg/dL
SQUAMOUS EPITHELIAL / LPF: NONE SEEN
Specific Gravity, Urine: 1.018 (ref 1.005–1.030)
pH: 6 (ref 5.0–8.0)

## 2017-12-31 MED ORDER — SODIUM CHLORIDE 0.9 % IV BOLUS (SEPSIS)
500.0000 mL | Freq: Once | INTRAVENOUS | Status: AC
  Administered 2017-12-31: 500 mL via INTRAVENOUS

## 2017-12-31 MED ORDER — AMITRIPTYLINE HCL 25 MG PO TABS
150.0000 mg | ORAL_TABLET | Freq: Every day | ORAL | Status: DC
  Administered 2018-01-01: 150 mg via ORAL
  Filled 2017-12-31: qty 6

## 2017-12-31 MED ORDER — PHENYTOIN SODIUM EXTENDED 100 MG PO CAPS
100.0000 mg | ORAL_CAPSULE | Freq: Three times a day (TID) | ORAL | Status: DC
  Administered 2018-01-01 – 2018-01-02 (×5): 100 mg via ORAL
  Filled 2017-12-31 (×7): qty 1

## 2017-12-31 MED ORDER — ONDANSETRON HCL 4 MG PO TABS: 4.0000 mg | ORAL_TABLET | Freq: Four times a day (QID) | ORAL | Status: DC | PRN

## 2017-12-31 MED ORDER — ENOXAPARIN SODIUM 40 MG/0.4ML ~~LOC~~ SOLN
40.0000 mg | SUBCUTANEOUS | Status: DC
  Administered 2018-01-01: 40 mg via SUBCUTANEOUS
  Filled 2017-12-31: qty 0.4

## 2017-12-31 MED ORDER — CEFTRIAXONE SODIUM IN DEXTROSE 20 MG/ML IV SOLN
1.0000 g | INTRAVENOUS | Status: DC
  Filled 2017-12-31: qty 50

## 2017-12-31 MED ORDER — AMITRIPTYLINE HCL 25 MG PO TABS
75.0000 mg | ORAL_TABLET | Freq: Every day | ORAL | Status: DC
  Administered 2018-01-01 – 2018-01-02 (×2): 75 mg via ORAL
  Filled 2017-12-31 (×2): qty 3

## 2017-12-31 MED ORDER — ONDANSETRON HCL 4 MG/2ML IJ SOLN: 4.0000 mg | Freq: Four times a day (QID) | INTRAMUSCULAR | Status: DC | PRN

## 2017-12-31 MED ORDER — POLYETHYLENE GLYCOL 3350 17 G PO PACK: 17.0000 g | PACK | Freq: Every day | ORAL | Status: DC | PRN

## 2017-12-31 MED ORDER — ALBUTEROL SULFATE (2.5 MG/3ML) 0.083% IN NEBU: 2.5000 mg | INHALATION_SOLUTION | RESPIRATORY_TRACT | Status: DC | PRN

## 2017-12-31 MED ORDER — DONEPEZIL HCL 5 MG PO TABS
5.0000 mg | ORAL_TABLET | Freq: Every day | ORAL | Status: DC
  Administered 2018-01-01 (×2): 5 mg via ORAL
  Filled 2017-12-31 (×3): qty 1

## 2017-12-31 MED ORDER — ATENOLOL 25 MG PO TABS
25.0000 mg | ORAL_TABLET | Freq: Every day | ORAL | Status: DC
Start: 2018-01-01 — End: 2018-01-02
  Administered 2018-01-01 – 2018-01-02 (×2): 25 mg via ORAL
  Filled 2017-12-31 (×2): qty 1

## 2017-12-31 MED ORDER — OXYCODONE HCL 5 MG PO TABS
5.0000 mg | ORAL_TABLET | ORAL | Status: DC | PRN
  Administered 2018-01-01 – 2018-01-02 (×5): 5 mg via ORAL
  Filled 2017-12-31 (×5): qty 1

## 2017-12-31 MED ORDER — AMITRIPTYLINE HCL 25 MG PO TABS: 150.0000 mg | ORAL_TABLET | Freq: Two times a day (BID) | ORAL | Status: DC

## 2017-12-31 MED ORDER — DEXTROSE 5 % IV SOLN
INTRAVENOUS | Status: DC
  Administered 2018-01-01 – 2018-01-02 (×2): via INTRAVENOUS
  Filled 2017-12-31 (×3): qty 10

## 2017-12-31 MED ORDER — DOCUSATE SODIUM 100 MG PO CAPS
100.0000 mg | ORAL_CAPSULE | Freq: Two times a day (BID) | ORAL | Status: DC
  Administered 2018-01-01 – 2018-01-02 (×3): 100 mg via ORAL
  Filled 2017-12-31 (×3): qty 1

## 2017-12-31 MED ORDER — LISINOPRIL 10 MG PO TABS
10.0000 mg | ORAL_TABLET | Freq: Every day | ORAL | Status: DC
  Administered 2018-01-01 – 2018-01-02 (×2): 10 mg via ORAL
  Filled 2017-12-31 (×2): qty 1

## 2017-12-31 MED ORDER — CEFTRIAXONE SODIUM IN DEXTROSE 20 MG/ML IV SOLN
1.0000 g | Freq: Once | INTRAVENOUS | Status: AC
Start: 2017-12-31 — End: 2017-12-31
  Administered 2017-12-31: 1 g via INTRAVENOUS
  Filled 2017-12-31: qty 50

## 2017-12-31 NOTE — ED Notes (Signed)
Pt aware that we need urine for UA. Pt states he "does not have to pee at this time." Urinal placed on side of bed for when pt does feel urge to urinate.

## 2017-12-31 NOTE — ED Provider Notes (Signed)
[facility] Emergency Department Provider Note ____________________________________________   First MD Initiated Contact with Patient 12/31/17 1241     (approximate)  I have reviewed the triage vital signs and the nursing notes.   HISTORY  Chief Complaint Fall; Back Pain; and Loss of Consciousness    HPI Jamie Parsons. is a 77 y.o. male with past medical history as noted below who presents primarily with right lower back pain, acute onset when the patient fell 2 days ago, persistent course, and worse with walking and with certain movements.  Patient states that 2 days ago he was at home sitting on a recliner, and got up to put on a record when he suddenly became very lightheaded and weak, and fell to the ground.  Patient states that he did not fully lose consciousness.  He states that since Saturday he has been feeling weaker than usual, although he has had some intermittent weakness and lightheadedness for the last several months.  Patient denies any fevers, chest pain, GI symptoms, or urinary symptoms.  Past Medical History:  Diagnosis Date  . COPD (chronic obstructive pulmonary disease) (HCC)   . Seizures (HCC)   . Stroke [facility])     Patient Active Problem List   Diagnosis Date Noted  . Adjustment disorder with depressed mood 09/15/2015  . History of major depression 09/15/2015  . History of alcohol abuse 09/15/2015    Past Surgical History:  Procedure Laterality Date  . BACK SURGERY      Prior to Admission medications   Medication Sig Start Date End Date Taking? Authorizing Provider  amitriptyline (ELAVIL) 50 MG tablet Take 150 mg by mouth 2 (two) times daily. Pt takes one tablet in the afternoon and two at bedtime.    Yes [provider]  atenolol (TENORMIN) 25 MG tablet Take 25 mg by mouth daily.   Yes [provider]  donepezil (ARICEPT) 5 MG tablet Take 5 mg by mouth at bedtime.   Yes [provider]    lisinopril (PRINIVIL,ZESTRIL) 10 MG tablet Take 10 mg by mouth daily.   Yes [provider]  phenytoin (DILANTIN) 100 MG ER capsule Take 100 mg by mouth 3 (three) times daily.    Yes [provider]  PROAIR HFA 108 (90 Base) MCG/ACT inhaler Inhale 1-2 puffs into the lungs every 4 (four) hours as needed. 10/05/17  Yes [provider]  acetaminophen (TYLENOL) 325 MG tablet Take 325-650 mg by mouth every 6 (six) hours as needed for mild pain or headache.    [provider]  clopidogrel (PLAVIX) 75 MG tablet Take 75 mg by mouth daily.    [provider]  predniSONE (STERAPRED UNI-PAK 21 TAB) 10 MG (21) TBPK tablet Take 6 tabs by mouth daily for 1st day then decrease by 1 tablet each day until finished on day 6. Patient not taking: Reported on 12/31/2017 07/10/17   Sudie Grumbling, NP    Allergies Penicillins  No family history on file.  Social History Social History   Tobacco Use  . Smoking status: Never Smoker  . Smokeless tobacco: Never Used  Substance Use Topics  . Alcohol use: No  . Drug use: No    Review of Systems  Constitutional: No fever. Eyes: No visual changes. ENT: No neck pain. Cardiovascular: Denies chest pain. Respiratory: Denies shortness of breath. Gastrointestinal: No nausea, no vomiting.  No diarrhea.  Genitourinary: Negative for dysuria.  Musculoskeletal: Positive for back pain. Skin: Negative  for rash. Neurological: Negative for headaches, focal weakness or numbness.   ____________________________________________   PHYSICAL EXAM:  VITAL SIGNS: ED Triage Vitals [12/31/17 1230]  Enc Vitals Group     BP 120/62     Pulse Rate (!) 109     Resp (!) 28     Temp 97.6 F (36.4 C)     Temp Source Oral     SpO2 99 %     Weight 150 lb (68 kg)     Height 6\' 3"  (1.905 m)     Head Circumference      Peak Flow      Pain Score 10     Pain Loc      Pain Edu?      Excl. in GC?     Constitutional: Alert and  oriented.  Mildly uncomfortable appearing but in no acute distress.. Eyes: Conjunctivae are normal.  EOMI.  PERRLA. Head: Atraumatic. Nose: No congestion/rhinnorhea. Mouth/Throat: Mucous membranes are slightly dry.   Neck: Normal range of motion.  Cervical spine nontender. Cardiovascular: Borderline tachycardic, regular rhythm. Grossly normal heart sounds.  Good peripheral circulation. Respiratory: Normal respiratory effort.  No retractions.  Decreased breath sounds bilaterally but lungs otherwise CTAB. Gastrointestinal: Soft and nontender. No distention.  Genitourinary: No CVA tenderness. Musculoskeletal: No midline spinal tenderness.  Moderate right posterior pelvic tenderness, and pain on range of motion of right hip with no deformity.  2+ DP pulses to bilateral lower extremities, and full range of motion of the joints. Neurologic:  Normal speech and language. No gross focal neurologic deficits are appreciated.  Motor and sensory intact in all extremities.  Normal coordination. Skin:  Skin is warm and dry. No rash noted. Psychiatric: Mood and affect are normal. Speech and behavior are normal.  ____________________________________________   LABS (all labs ordered are listed, but only abnormal results are displayed)  Labs Reviewed  BASIC METABOLIC PANEL - Abnormal; Notable for the following components:      Result Value   Chloride 99 (*)    Glucose, Bld 101 (*)    BUN 24 (*)    Creatinine, Ser 1.38 (*)    Calcium 8.8 (*)    GFR calc non Af Amer 47 (*)    GFR calc Af Amer 55 (*)    All other components within normal limits  CBC - Abnormal; Notable for the following components:   RBC 3.90 (*)    Hemoglobin 12.1 (*)    HCT 35.6 (*)    All other components within normal limits  URINALYSIS, COMPLETE (UACMP) WITH MICROSCOPIC - Abnormal; Notable for the following components:   Color, Urine AMBER (*)    APPearance CLOUDY (*)    Ketones, ur 5 (*)    Leukocytes, UA SMALL (*)     Bacteria, UA MANY (*)    All other components within normal limits  TROPONIN I  CBG MONITORING, ED   ____________________________________________  EKG  ED ECG REPORT I, Dionne BucySebastian Keanthony Poole, the attending physician, personally viewed and interpreted this ECG.  Date: 12/31/2017 EKG Time: 1237 Rate: 103 Rhythm: Sinus tachycardia QRS Axis: Borderline right axis Intervals: normal ST/T Wave abnormalities: Poor baseline; no focal abnormalities Narrative Interpretation: no evidence of acute ischemia; no significant change when compared to EKG of 09/15/2015  ____________________________________________  RADIOLOGY  CXR: No focal infiltrate R hip XR: No acute fracture Pelvis XR: No acute fracture CT L spine: L2 compression fracture CT head: No ICH or other acute findings  ____________________________________________  PROCEDURES  Procedure(s) performed: No    Critical Care performed: No ____________________________________________   INITIAL IMPRESSION / ASSESSMENT AND PLAN / ED COURSE  Pertinent labs & imaging results that were available during my care of the patient were reviewed by me and considered in my medical decision making (see chart for details).  77 year old male with past medical history as noted above presents with persistent right lower back/posterior pelvis area pain after a fall 2 days ago, which occurred when the patient had a near syncope, with persistent weakness and lightheadedness since that time.  Past medical records reviewed in Epic; patient was most recently seen in the ED 2 years ago for generalized weakness as well as depression, as well as last year for neck and shoulder pain and likely muscle strain, with no other recent visits.  On exam, the patient is slightly tachycardic but the remainder the vital signs are normal.  He is somewhat chronically weak appearing but in no distress and the remainder the exam is as described above.  In terms of  possible injury, I am most concerned with pelvic versus hip versus less likely lumbosacral fracture versus contusion.  Will obtain CT of the lumbar spine, and x-rays of the right hip and the pelvis.  I will also obtain a CT head given possibility of head injury and patient's age.  In terms of his generalized weakness, differential is broad but will obtain workup to rule out infection, renal insufficiency, dehydration or other metabolic cause, or cardiac etiology.   ----------------------------------------- 4:57 PM on 12/31/2017 -----------------------------------------  Workup reveals L2 compression fracture.  Patient has no neurologic deficits associated with this so it is likely nonoperative.  I will consult orthopedics for any recommendations.  UA is also consistent with possible UTI which could be contributed patient's generalized weakness.  Given his difficulty with mobility, his generalized weakness due to the likely UTI, I will admit for antibiotics, pain control and PT evaluation.  ----------------------------------------- 5:01 PM on 12/31/2017 -----------------------------------------  I consulted Dr. Martha Clan from orthopedics in reference to the L2 fracture.  He recommends conservative management and advises that there is no indication for acute intervention at this time.  ____________________________________________   FINAL CLINICAL IMPRESSION(S) / ED DIAGNOSES  Final diagnoses:  Closed compression fracture of second lumbar vertebra, initial encounter (HCC)  Urinary tract infection without hematuria, site unspecified      NEW MEDICATIONS STARTED DURING THIS VISIT:  New Prescriptions   No medications on file     Note:  This document was prepared using Dragon voice recognition software and may include unintentional dictation errors.    Dionne Bucy, MD 12/31/17 [phone]

## 2017-12-31 NOTE — ED Notes (Signed)
Report given to Kasey RN

## 2017-12-31 NOTE — ED Notes (Signed)
Pt assisted to BR with standby assist.
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
## 2017-12-31 NOTE — ED Notes (Signed)
Floor unable to take report at this time.

## 2017-12-31 NOTE — ED Notes (Signed)
Pt assisted to use urinal to void 

## 2018-01-01 ENCOUNTER — Other Ambulatory Visit: Payer: Self-pay

## 2018-01-01 MED ORDER — ENSURE ENLIVE PO LIQD
237.0000 mL | Freq: Two times a day (BID) | ORAL | Status: DC
  Administered 2018-01-02: 237 mL via ORAL

## 2018-01-01 MED ORDER — SODIUM CHLORIDE 0.9 % IV SOLN
INTRAVENOUS | Status: DC
  Administered 2018-01-01: 17:00:00 via INTRAVENOUS
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
## 2018-01-01 NOTE — Progress Notes (Signed)
Chaplain responded to a OR for an AD. Chaplain left AD as pt didn't have reading glasses and was having breakfast. Chaplain offered prayer as Pt talked about his wife. Chaplain will follow up.   01/01/18 0900  Clinical Encounter Type  Visited With Patient  Visit Type Initial;Follow-up  Referral From Nurse  Spiritual Encounters  Spiritual Needs Brochure;Prayer

## 2018-01-01 NOTE — Progress Notes (Signed)
PT Cancellation Note  Patient Details Name: Jamie CellaDavid James Rempel Jr. MRN: [id number] DOB: [birth year]   Cancelled Treatment:    Reason Eval/Treat Not Completed: (waiting on lumbar corset before getting up) Attempted to see pt, spoke with nursing this afternoon about lumbar corset, needs to be brought by vendor, not on the floor as of 1600.  Will try back tomorrow for PT exam.  Malachi ProGalen R Nataliyah Packham, DPT 01/01/2018, 5:23 PM

## 2018-01-01 NOTE — Progress Notes (Signed)
Initial Nutrition Assessment  DOCUMENTATION CODES:   Severe malnutrition in context of chronic illness  INTERVENTION:  Recommend liberalizing diet to regular.  Provide Ensure Enlive po BID, each supplement provides 350 kcal and 20 grams of protein. Patient prefers vanilla.  Provide Magic cup TID with meals, each supplement provides 290 kcal and 9 grams of protein.  NUTRITION DIAGNOSIS:   Severe Malnutrition related to chronic illness(COPD) as evidenced by severe fat depletion, moderate muscle depletion, severe muscle depletion.  GOAL:   Patient will meet greater than or equal to 90% of their needs  MONITOR:   PO intake, Supplement acceptance, Labs, Weight trends, Skin, I & O's  REASON FOR ASSESSMENT:   Malnutrition Screening Tool    ASSESSMENT:   77 year old male with PMHx of CVA, seizures, COPD, gait abnormalities admitted with UTI, acute L2 compression fracture.   Met with patient at bedside. He reports he has had a decreased appetite the last few weeks. He is not sure what is causing his decreased appetite. He reports eating 2 meals per day. He usually has chicken or fish with sides. Unable to provide any more details on intake. He reports he is eating fairly well here. Noted in chart he finished about 50% of his breakfast this morning.  He reports his UBW was 170 lbs, but he has lost weight over many years. Limited weight history in chart.  Medications reviewed and include: Colace, lisinopril 10 mg daily, ceftriaxone.   Labs reviewed: Chloride 99, BUN 24, Creatinine 1.38.  NUTRITION - FOCUSED PHYSICAL EXAM:    Most Recent Value  Orbital Region  Severe depletion  Upper Arm Region  Severe depletion  Thoracic and Lumbar Region  Moderate depletion  Buccal Region  Severe depletion  Temple Region  Severe depletion  Clavicle Bone Region  Severe depletion  Clavicle and Acromion Bone Region  Severe depletion  Scapular Bone Region  Severe depletion  Dorsal Hand   Moderate depletion  Patellar Region  Moderate depletion  Anterior Thigh Region  Moderate depletion  Posterior Calf Region  Moderate depletion  Edema (RD Assessment)  None  Hair  Reviewed  Eyes  Reviewed  Mouth  Reviewed  Skin  Reviewed  Nails  Reviewed     Diet Order:  Diet Heart Room service appropriate? Yes; Fluid consistency: Thin  EDUCATION NEEDS:   Not appropriate for education at this time  Skin:  Skin Assessment: Reviewed RN Assessment  Last BM:  PTA (12/30/2017 per chart)  Height:   Ht Readings from Last 1 Encounters:  12/31/17 _0  (1.905 m)    Weight:   Wt Readings from Last 1 Encounters:  01/01/18 148 lb 2.4 oz (67.2 kg)    Ideal Body Weight:  89.1 kg  BMI:  Body mass index is 18.52 kg/m.  Estimated Nutritional Needs:   Kcal:  9892-1194 (MSJ x 1.2-1.4)  Protein:  80-90 grams (1.2-1.3 grams/kg)  Fluid:  1.7 L/day (25 mL/kg)  Willey Blade, MS, RD, LDN Office: [phone] Pager: [phone] After Hours/Weekend Pager: [phone]

## 2018-01-01 NOTE — Clinical Social Work Note (Addendum)
Clinical Social Work Assessment  Patient Details  Name: Jamie Parsons. MRN: [id number] Date of Birth: 1941/04/18  Date of referral:  01/01/18               Reason for consult:  Facility Placement, Discharge Planning                Permission sought to share information with:    Permission granted to share information::     Name::      [place]::   [place]  Relationship::     Contact Information:     Housing/Transportation Living arrangements for the past 2 months:  [place] of Information:  Patient Patient Interpreter Needed:  None Criminal Activity/Legal Involvement Pertinent to Current Situation/Hospitalization:  No - Comment as needed Significant Relationships:  Spouse Lives with:  Spouse Do you feel safe going back to the place where you live?  Yes Need for family participation in patient care:  Yes (Comment)  Care giving concerns:  Patient lives in [place] with wife Jamie Parsons [phone]).   Social Worker assessment / plan: Holiday representative (CSW) reviewed chart and noted that patient is having difficulty ambulating. PT is pending. Social work Theatre manager met with patient alone at bedside. Patient was sitting up in bed alert and oriented x4. Social work Theatre manager introduced self and explained the role of the [place]. Patient stated that he lives in [place] with his wife Jamie Parsons. Social work Theatre manager explained that once patient works with PT they will recommend SNF or [place]. Social work Theatre manager also explained that [employer] will have to approve for SNF. Patient verbalized his understanding and prefers to go home with Home Health. Patient shared that his wife is to have hip replacement surgery in two weeks and his sisters are busy so he would like to be at home. FL2 completed. RN case manager aware of above. CSW and social work Theatre manager will continue to follow up and assist.  Employment status:  Retired Radiation protection practitioner:  Managed Care PT Recommendations:  Not assessed at this time Information / Referral to community resources:     Patient/Family's Response to care: Patient prefers to go home with [employer].  Patient/Family's Understanding of and Emotional Response to Diagnosis, Current Treatment, and Prognosis: Patient was pleasant and thanked social work Theatre manager for her assistance.  Emotional Assessment Appearance:  Appears stated age Attitude/Demeanor/Rapport:    Affect (typically observed):  Sad, Pleasant, Anxious Orientation:  Oriented to Self, Oriented to Place, Oriented to  Time, Oriented to Situation Alcohol / Substance use:  Not Applicable Psych involvement (Current and /or in the community):  No (Comment)  Discharge Needs  Concerns to be addressed:  Care Coordination, Discharge Planning Concerns Readmission within the last 30 days:  No Current discharge risk:  Dependent with Mobility Barriers to Discharge:  Continued Medical Work up   Smith Mince, Student-Social Work 01/01/2018, 11:42 AM

## 2018-01-01 NOTE — Plan of Care (Signed)
  Education: Knowledge of General Education information will improve 01/01/2018 0558 - Progressing by Annely Sliva, Genelle GatherMatthew Scott, RN   Health Behavior/Discharge Planning: Ability to manage health-related needs will improve 01/01/2018 0558 - Progressing by Kiev Labrosse, Genelle GatherMatthew Scott, RN   Clinical Measurements: Ability to maintain clinical measurements within normal limits will improve 01/01/2018 0558 - Progressing by Jarad Barth, Genelle GatherMatthew Scott, RN Will remain free from infection 01/01/2018 0558 - Progressing by Reza Crymes, Genelle GatherMatthew Scott, RN Diagnostic test results will improve 01/01/2018 [phone] - Progressing by Jeremia Groot, Genelle GatherMatthew Scott, RN Respiratory complications will improve 01/01/2018 0558 - Progressing by Roxana HiresPage, Sira Adsit Scott, RN Cardiovascular complication will be avoided 01/01/2018 0558 - Progressing by Tynlee Bayle, Genelle GatherMatthew Scott, RN   Clinical Measurements: Will remain free from infection 01/01/2018 0558 - Progressing by Auther Lyerly, Genelle GatherMatthew Scott, RN   Clinical Measurements: Diagnostic test results will improve 01/01/2018 0558 - Progressing by Keisha Amer, Genelle GatherMatthew Scott, RN   Clinical Measurements: Respiratory complications will improve 01/01/2018 0558 - Progressing by Tremar Wickens, Genelle GatherMatthew Scott, RN   Nutrition: Adequate nutrition will be maintained 01/01/2018 0558 - Progressing by Roxana HiresPage, Marytza Grandpre Scott, RN   Activity: Risk for activity intolerance will decrease 01/01/2018 0558 - Progressing by Alexianna Nachreiner, Genelle GatherMatthew Scott, RN

## 2018-01-01 NOTE — Progress Notes (Signed)
[employer] [facility] [place] [facility]   PATIENT NAME: Jamie HookDavid Parsons    MR#:  [id number]  DATE OF BIRTH:  25-Mar-1941  SUBJECTIVE:  CHIEF COMPLAINT:   Chief Complaint  Patient presents with  . Fall  . Back Pain  . Loss of Consciousness   Better back pain. REVIEW OF SYSTEMS:  Review of Systems  Constitutional: Negative for chills, fever and malaise/fatigue.  HENT: Negative for sore throat.   Eyes: Negative for blurred vision and double vision.  Respiratory: Negative for cough, hemoptysis, shortness of breath, wheezing and stridor.   Cardiovascular: Negative for chest pain, palpitations, orthopnea and leg swelling.  Gastrointestinal: Negative for abdominal pain, blood in stool, diarrhea, melena, nausea and vomiting.  Genitourinary: Negative for dysuria, flank pain and hematuria.  Musculoskeletal: Positive for back pain. Negative for joint pain.  Skin: Negative for rash.  Neurological: Negative for dizziness, sensory change, focal weakness, seizures, loss of consciousness, weakness and headaches.  Endo/Heme/Allergies: Negative for polydipsia.  Psychiatric/Behavioral: Negative for depression. The patient is not nervous/anxious.     DRUG ALLERGIES:   Allergies  Allergen Reactions  . Penicillins Other (See Comments)    Pt states that this medication "makes him crazy".   Has patient had a PCN reaction causing immediate rash, facial/tongue/throat swelling, SOB or lightheadedness with hypotension: No Has patient had a PCN reaction causing severe rash involving mucus membranes or skin necrosis: No Has patient had a PCN reaction that required hospitalization No Has patient had a PCN reaction occurring within the last 10 years: No If all of the above answers are "NO", then may proceed with Cephalosporin use.   VITALS:  Blood pressure (!) 117/56, pulse 98, temperature (!) 97.5 F (36.4 C), temperature source Oral, resp. rate 20, height 6\' 3"  (1.905 m), weight 148 lb  2.4 oz (67.2 kg), SpO2 96 %. PHYSICAL EXAMINATION:  Physical Exam  Constitutional: He is oriented to person, place, and time and well-developed, well-nourished, and in no distress.  HENT:  Head: Normocephalic.  Mouth/Throat: Oropharynx is clear and moist.  Eyes: Conjunctivae and EOM are normal. Pupils are equal, round, and reactive to light. No scleral icterus.  Neck: Normal range of motion. Neck supple. No JVD present. No tracheal deviation present.  Cardiovascular: Normal rate, regular rhythm and normal heart sounds. Exam reveals no gallop.  No murmur heard. Pulmonary/Chest: Effort normal and breath sounds normal. No respiratory distress. He has no wheezes. He has no rales.  Abdominal: Soft. Bowel sounds are normal. He exhibits no distension. There is no tenderness. There is no rebound.  Musculoskeletal: Normal range of motion. He exhibits no edema or tenderness.  Neurological: He is alert and oriented to person, place, and time. No cranial nerve deficit.  Skin: No rash noted. No erythema.  Psychiatric: Affect normal.   LABORATORY PANEL:  Male CBC Recent Labs  Lab 12/31/17 1234  WBC 9.3  HGB 12.1*  HCT 35.6*  PLT 221   ------------------------------------------------------------------------------------------------------------------ Chemistries  Recent Labs  Lab 12/31/17 1234  NA 136  K 4.2  CL 99*  CO2 29  GLUCOSE 101*  BUN 24*  CREATININE 1.38*  CALCIUM 8.8*   RADIOLOGY:  No results found. ASSESSMENT AND PLAN:    *UTI.  Patient has significant weakness due to the UTI.   New ceftriaxone.  Send urine cultures.  Change to oral antibiotics per sensitivities.  *Acute L2 compression fracture.  Per Dr. Martha ClanKrasinski, recommend the patient receive a lumbosacral corset to help stabilize his lumbar  spine. I recommend physical therapy evaluation. Continue current pain management.  Back pain is better. Follow-up PT evaluation.  * Acute renal failure due to dehydration.  IV  fluids support and follow-up BMP.  *COPD.  Continue home inhalers.  Nebulizers as needed.  Not needing oxygen.  No wheezing.  All the records are reviewed and case discussed with Care Management/Social Worker. Management plans discussed with the patient, family and they are in agreement.  CODE STATUS: Full Code  TOTAL TIME TAKING CARE OF THIS PATIENT: 35 minutes.   More than 50% of the time was spent in counseling/coordination of care: YES  POSSIBLE D/C IN 1-2 DAYS, DEPENDING ON CLINICAL CONDITION.   Shaune Pollack M.D on 01/01/2018 at 4:18 PM  Between 7am to 6pm - Pager - [phone]  After 6pm go to [email or web address] - Therapist, nutritional Hospitalists

## 2018-01-01 NOTE — [place] FL2 (Signed)
[place] MEDICAID FL2 LEVEL OF CARE SCREENING TOOL     IDENTIFICATION  Patient Name: Jamie Parsons. Birthdate: 25-Apr-1941 Sex: male Admission Date (Current Location): 12/31/2017  [place] and [place] Number:  Chiropodist and Address:  [facility], [address] [place], [place] [address]      Provider Number: [id number]  Attending Physician Name and Address:  Shaune Pollack, MD  Relative Name and Phone Number:       Current Level of Care: Hospital Recommended Level of Care: Skilled Nursing Facility Prior Approval Number:    Date Approved/Denied:   PASRR Number: [id number])  Discharge Plan: SNF    Current Diagnoses: Patient Active Problem List   Diagnosis Date Noted  . UTI (urinary tract infection) 12/31/2017  . Adjustment disorder with depressed mood 09/15/2015  . History of major depression 09/15/2015  . History of alcohol abuse 09/15/2015    Orientation RESPIRATION BLADDER Height & Weight     Self, Time, Situation, Place  Normal Continent Weight: 148 lb 2.4 oz (67.2 kg) Height:  6\' 3"  (190.5 cm)  BEHAVIORAL SYMPTOMS/MOOD NEUROLOGICAL BOWEL NUTRITION STATUS      Continent Diet(Heart Healthy)  AMBULATORY STATUS COMMUNICATION OF NEEDS Skin   Extensive Assist Verbally Normal                       Personal Care Assistance Level of Assistance  Bathing, Feeding, Dressing Bathing Assistance: Limited assistance Feeding assistance: Independent Dressing Assistance: Limited assistance     Functional Limitations Info  Sight, Hearing, Speech Sight Info: Adequate Hearing Info: Adequate Speech Info: Adequate    SPECIAL CARE FACTORS FREQUENCY  PT (By licensed PT), OT (By licensed OT)     PT Frequency: (5) OT Frequency: (5)            Contractures      Additional Factors Info  Code Status, Allergies Code Status Info: (Full Code) Allergies Info: (PENICILLINS)           Current Medications (01/01/2018):   This is the current hospital active medication list Current Facility-Administered Medications  Medication Dose Route Frequency Provider Last Rate Last Dose  . albuterol (PROVENTIL) (2.5 MG/3ML) 0.083% nebulizer solution 2.5 mg  2.5 mg Nebulization Q2H PRN Sudini, Wardell Heath, MD      . amitriptyline (ELAVIL) tablet 75 mg  75 mg Oral Q1200 Shaune Pollack, MD       And  . amitriptyline (ELAVIL) tablet 150 mg  150 mg Oral QHS Shaune Pollack, MD      . atenolol (TENORMIN) tablet 25 mg  25 mg Oral Daily [name], MD   25 mg at 01/01/18 0901  . cefTRIAXone (ROCEPHIN) 1 g in dextrose 5 % 50 mL injection   Intravenous Q24H Sudini, Srikar, MD      . docusate sodium (COLACE) capsule 100 mg  100 mg Oral BID [name], MD   100 mg at 01/01/18 0901  . donepezil (ARICEPT) tablet 5 mg  5 mg Oral QHS [name], MD   5 mg at 01/01/18 0101  . enoxaparin (LOVENOX) injection 40 mg  40 mg Subcutaneous Q24H Sudini, Srikar, MD      . lisinopril (PRINIVIL,ZESTRIL) tablet 10 mg  10 mg Oral Daily [name], MD   10 mg at 01/01/18 0901  . ondansetron (ZOFRAN) tablet 4 mg  4 mg Oral Q6H PRN [name], MD       Or  . ondansetron [facility]) injection 4  mg  4 mg Intravenous Q6H PRN Sudini, Wardell HeathSrikar, MD      . oxyCODONE (Oxy IR/ROXICODONE) immediate release tablet 5 mg  5 mg Oral Q4H PRN Milagros LollSudini, Srikar, MD   5 mg at 01/01/18 0901  . phenytoin (DILANTIN) ER capsule 100 mg  100 mg Oral TID Milagros LollSudini, Srikar, MD   100 mg at 01/01/18 0902  . polyethylene glycol (MIRALAX / GLYCOLAX) packet 17 g  17 g Oral Daily PRN Milagros LollSudini, Srikar, MD         Discharge Medications: Please see discharge summary for a list of discharge medications.  Relevant Imaging Results:  Relevant Lab Results:   Additional Information (SSN: [id number])  Payton SparkAnanda A Bryson Gavia, Student-Social Work

## 2018-01-02 ENCOUNTER — Other Ambulatory Visit: Payer: Self-pay

## 2018-01-02 ENCOUNTER — Emergency Department
Admission: EM | Admit: 2018-01-02 | Discharge: 2018-01-03 | Disposition: A | Discharge status: Home / Self Care | Payer: PPO | Attending: Emergency Medicine | Admitting: Emergency Medicine

## 2018-01-02 ENCOUNTER — Emergency Department: Payer: PPO

## 2018-01-02 DIAGNOSIS — R531 Weakness: Secondary | ICD-10-CM

## 2018-01-02 DIAGNOSIS — S79911A Unspecified injury of right hip, initial encounter: Secondary | ICD-10-CM | POA: Diagnosis not present

## 2018-01-02 DIAGNOSIS — Z79899 Other long term (current) drug therapy: Secondary | ICD-10-CM | POA: Insufficient documentation

## 2018-01-02 DIAGNOSIS — W19XXXA Unspecified fall, initial encounter: Secondary | ICD-10-CM

## 2018-01-02 DIAGNOSIS — Z7902 Long term (current) use of antithrombotics/antiplatelets: Secondary | ICD-10-CM | POA: Insufficient documentation

## 2018-01-02 DIAGNOSIS — J449 Chronic obstructive pulmonary disease, unspecified: Secondary | ICD-10-CM | POA: Insufficient documentation

## 2018-01-02 DIAGNOSIS — M25551 Pain in right hip: Secondary | ICD-10-CM | POA: Diagnosis not present

## 2018-01-02 DIAGNOSIS — S299XXA Unspecified injury of thorax, initial encounter: Secondary | ICD-10-CM | POA: Diagnosis not present

## 2018-01-02 LAB — BASIC METABOLIC PANEL
Anion gap: 9 (ref 5–15)
BUN: 22 mg/dL — ABNORMAL HIGH (ref 6–20)
CALCIUM: 8.5 mg/dL — AB (ref 8.9–10.3)
CO2: 25 mmol/L (ref 22–32)
CREATININE: 1.02 mg/dL (ref 0.61–1.24)
Chloride: 103 mmol/L (ref 101–111)
GFR calc non Af Amer: >60 mL/min (ref >60)
GLUCOSE: 94 mg/dL (ref 65–99)
Potassium: 4.1 mmol/L (ref 3.5–5.1)
Sodium: 137 mmol/L (ref 135–145)

## 2018-01-02 MED ORDER — OXYCODONE HCL 5 MG PO TABS: 5.0000 mg | ORAL_TABLET | Freq: Four times a day (QID) | ORAL | 0 refills | Status: DC | PRN

## 2018-01-02 MED ORDER — SODIUM CHLORIDE 0.9 % IV BOLUS (SEPSIS)
1000.0000 mL | Freq: Once | INTRAVENOUS | Status: AC
  Administered 2018-01-03: 1000 mL via INTRAVENOUS

## 2018-01-02 MED ORDER — CEPHALEXIN 250 MG PO CAPS: 250.0000 mg | ORAL_CAPSULE | Freq: Three times a day (TID) | ORAL | 0 refills | Status: AC

## 2018-01-02 MED ORDER — IPRATROPIUM-ALBUTEROL 0.5-2.5 (3) MG/3ML IN SOLN
3.0000 mL | Freq: Once | RESPIRATORY_TRACT | Status: AC
  Administered 2018-01-03: 3 mL via RESPIRATORY_TRACT
  Filled 2018-01-02: qty 3

## 2018-01-02 NOTE — ED Provider Notes (Signed)
[facility] Emergency Department Provider Note   ____________________________________________   First MD Initiated Contact with Patient 01/02/18 2311     (approximate)  I have reviewed the triage vital signs and the nursing notes.   HISTORY  Chief Complaint Fall    HPI Jamie Parsons. is a 77 y.o. male to the ED from home with a chief complaint of fall and generalized weakness.  Patient was discharged from the hospital earlier today status post same; found to have L2 compression fracture treated in a brace.  Also found to have UTI being treated with Levaquin.  Patient states he has not yet filled his prescription.  States he got up from dinner, his left leg gave out, and he fell, striking his right hip.  Complains of right hip pain.  Denies striking head or LOC.  Denies headache, neck pain, chest pain, shortness of breath, abdominal pain, nausea, vomiting, diarrhea.   Past Medical History:  Diagnosis Date  . COPD (chronic obstructive pulmonary disease) (HCC)   . Seizures (HCC)   . Stroke [facility])     Patient Active Problem List   Diagnosis Date Noted  . UTI (urinary tract infection) 12/31/2017  . Adjustment disorder with depressed mood 09/15/2015  . History of major depression 09/15/2015  . History of alcohol abuse 09/15/2015    Past Surgical History:  Procedure Laterality Date  . BACK SURGERY      Prior to Admission medications   Medication Sig Start Date End Date Taking? Authorizing Provider  acetaminophen (TYLENOL) 325 MG tablet Take 325-650 mg by mouth every 6 (six) hours as needed for mild pain or headache.   Yes [provider]  amitriptyline (ELAVIL) 50 MG tablet Take 150 mg by mouth 2 (two) times daily. Pt takes one tablet in the afternoon and two at bedtime.    Yes [provider]  atenolol (TENORMIN) 25 MG tablet Take 25 mg by mouth daily.   Yes [provider]  cephALEXin (KEFLEX) 250 MG capsule Take  1 capsule (250 mg total) by mouth 3 (three) times daily for 3 days. 01/02/18 01/05/18 Yes Sudini, Wardell Heath, MD  donepezil (ARICEPT) 5 MG tablet Take 5 mg by mouth at bedtime.   Yes [provider]  lisinopril (PRINIVIL,ZESTRIL) 10 MG tablet Take 10 mg by mouth daily.   Yes [provider]  oxyCODONE (OXY IR/ROXICODONE) 5 MG immediate release tablet Take 1 tablet (5 mg total) by mouth every 6 (six) hours as needed for severe pain. 01/02/18  Yes Sudini, Wardell Heath, MD  phenytoin (DILANTIN) 100 MG ER capsule Take 100 mg by mouth 3 (three) times daily.    Yes [provider]  PROAIR HFA 108 (90 Base) MCG/ACT inhaler Inhale 1-2 puffs into the lungs every 4 (four) hours as needed. 10/05/17  Yes [provider]  clopidogrel (PLAVIX) 75 MG tablet Take 75 mg by mouth daily.    [provider]    Allergies Penicillins  Family History  Problem Relation Age of Onset  . CAD Other     Social History Social History   Tobacco Use  . Smoking status: Never Smoker  . Smokeless tobacco: Never Used  Substance Use Topics  . Alcohol use: No  . Drug use: No    Review of Systems  Constitutional: No fever/chills. Eyes: No visual changes. ENT: No sore throat. Cardiovascular: Denies chest pain. Respiratory: Denies shortness of breath. Gastrointestinal: No abdominal pain.  No nausea, no vomiting.  No  diarrhea.  No constipation. Genitourinary: Negative for dysuria. Musculoskeletal: Positive for right hip and lower back pain. Skin: Negative for rash. Neurological: Negative for headaches, focal weakness or numbness.   ____________________________________________   PHYSICAL EXAM:  VITAL SIGNS: ED Triage Vitals  Enc Vitals Group     BP 01/02/18 2250 117/64     Pulse Rate 01/02/18 2250 98     Resp 01/02/18 2250 12     Temp 01/02/18 2250 97.8 F (36.6 C)     Temp Source 01/02/18 2250 Oral     SpO2 01/02/18 2236 95 %     Weight 01/02/18 2252 145 lb (65.8 kg)      Height 01/02/18 2252 6\' 3"  (1.905 m)     Head Circumference --      Peak Flow --      Pain Score 01/02/18 2251 10     Pain Loc --      Pain Edu? --      Excl. in GC? --     Constitutional: Alert and oriented. Well appearing and in mild acute distress. Eyes: Conjunctivae are normal. PERRL. EOMI. Head: Atraumatic. Nose: No congestion/rhinnorhea. Mouth/Throat: Mucous membranes are moist.  Oropharynx non-erythematous. Neck: No stridor.  No cervical spine tenderness to palpation. Cardiovascular: Normal rate, regular rhythm. Grossly normal heart sounds.  Good peripheral circulation. Respiratory: Normal respiratory effort.  No retractions. Lungs with scattered wheezing. Gastrointestinal: Soft and nontender. No distention. No abdominal bruits. No CVA tenderness. Musculoskeletal: Lumbar spine tender to palpation. Right hip tender to palpation. There is no shortening or rotation. 2+ distal pulses. Brisk, less than 5 second capillary refill. Neurologic:  Normal speech and language. No gross focal neurologic deficits are appreciated.  Skin:  Skin is warm, dry and intact. No rash noted. Psychiatric: Mood and affect are normal. Speech and behavior are normal.  ____________________________________________   LABS (all labs ordered are listed, but only abnormal results are displayed)  Labs Reviewed  CBC WITH DIFFERENTIAL/PLATELET - Abnormal; Notable for the following components:      Result Value   RBC 3.94 (*)    Hemoglobin 12.2 (*)    HCT 35.7 (*)    Neutro Abs 7.2 (*)    Lymphs Abs 0.3 (*)    Monocytes Absolute 1.3 (*)    All other components within normal limits  COMPREHENSIVE METABOLIC PANEL - Abnormal; Notable for the following components:   Chloride 98 (*)    Glucose, Bld 114 (*)    BUN 22 (*)    Calcium 8.8 (*)    Albumin 3.4 (*)    All other components within normal limits  TROPONIN I   ____________________________________________  EKG  ED ECG REPORT I, Emslee Lopezmartinez J, the  attending physician, personally viewed and interpreted this ECG.   Date: 01/02/2018  EKG Time: 2257  Rate: 89  Rhythm: normal EKG, normal sinus rhythm  Axis: RAD  Intervals:none  ST&T Change: Nonspecific  ____________________________________________  RADIOLOGY  Right hip and pelvis x-rays read by me, interpreted per Dr. Andria Meuse:  Degenerative changes in the lower lumbar spine and in both hips. No  acute displaced fractures identified.      Chest one view viewed by me, interpreted per Dr. Andria Meuse:  No active disease.  CT Right Hip viewed by me, interpreted per Dr. Andria Meuse:  Degenerative changes in the right hip. No evidence of acute fracture  or dislocation.    ____________________________________________   PROCEDURES  Procedure(s) performed: None  Procedures  Critical Care performed: No  ____________________________________________   INITIAL IMPRESSION / ASSESSMENT AND PLAN / ED COURSE  As part of my medical decision making, I reviewed the following data within the electronic MEDICAL RECORD NUMBER Nursing notes reviewed and incorporated, Labs reviewed, EKG interpreted, Old chart reviewed, Radiograph reviewed and Notes from prior ED visits.   [age] year old male who presents status post fall with right hip pain.  Differential diagnosis includes but is not limited to right hip fracture, weakness due to infectious, metabolic, cardiac etiologies, etc.  Patient was just discharged from the hospital earlier today status post fall due to generalized weakness, UTI and found to have L2 compression fracture.  Will repeat lab work, initiate IV fluid resuscitation, obtain x-rays and reassess.  Nebulizer treatment given for scattered wheezing heard on exam. Noted patient still has his unfilled prescriptions for oxycodone and keflex with him.  Clinical Course as of Jan 03 715  Thu Jan 03, 2018  0129 Patient resting in NAD. Updated him of laboratory and imaging results. We discussed  sending him to rehab but he desires to go home as his wife is scheduled for surgery next week. Will ambulate patient with walker and reassess.  [JS]  0144 Patient very unsteady with walker. Will obtain CT to evaluate occult pelvic fracture. Long discussion with patient regarding his safety to go home. Agrees to stay for social work evaluation for rehab.  [JS]  [id number] Patient sleeping. Updated him of CT results. Will remain in the ED overnight pending CSW and PT evaluations.  [JS]  0715 Patient asleep in NAD. Remains in the ED pending CSW and PT evaluations.  [JS]    Clinical Course User Index [JS] Irean HongSung, Caleesi Kohl J, MD     ____________________________________________   FINAL CLINICAL IMPRESSION(S) / ED DIAGNOSES  Final diagnoses:  Fall, initial encounter  Weakness     ED Discharge Orders    None       Note:  This document was prepared using Dragon voice recognition software and may include unintentional dictation errors.    Irean HongSung, Anielle Headrick J, MD 01/03/18 [phone]

## 2018-01-02 NOTE — Discharge Instructions (Signed)
Wear your brace anytime you are out of bed

## 2018-01-02 NOTE — Progress Notes (Signed)
Discharge instructions reviewed with patient. Appointments highlighted and discussed with patient. [facility] to follow up for PT. Patient [facility] (patient states this is his pharmacy). Lumbar brace instructions given to patient.

## 2018-01-02 NOTE — ED Triage Notes (Signed)
Pt to the er via ems for pain to the right hip following a fall. Pt recently seen in hospital on Saturday for a cracked vertebrae r/t a fall. Pt has back brace with him. No shortening or rotation noted. No LOC, no head injury, VSS by EMS.

## 2018-01-02 NOTE — Progress Notes (Signed)
[facility] care planning  Met with patient and aunt discussed regarding his L2 compression fracture and pain control.  Patient's pain is improved.  Explained his good prognosis the significant improvement he has had during the hospital stay.  He tells me he has been debilitated for L while and uses a cane at baseline.  He and his wife have disabilities and help each other.  Lives in an apartment on the ground floor with neighbors who help them.  Will likely need home health at discharge.  Discussed regarding CODE STATUS and patient tells me he does not want to be intubated or resuscitated.  DNR orders entered.  CODE STATUS changed  Time spent 20 minutes

## 2018-01-02 NOTE — Evaluation (Signed)
Physical Therapy Evaluation Patient Details Name: Jamie Parsons. MRN: [id number] DOB: 1941-09-25 Today's Date: 01/02/2018   History of Present Illness  Pt admitted for UTI.  PMH includes COPD, Htn, gait abnormality, CVA and seizures.  Clinical Impression  Pt is a 77 year old male who lives in a ground floor apartment with his wife.  Pt reported 0/10 pain upon PT arrival and presented with WNL strength of UE/LE, sensation to light touch intact.  Pt is able to perform STS transfer with RW and no assistance from PT.  He was able to ambulate 200 ft with RW and frequent VC's for safe management of AD.  Pt reported no increase in LBP with mobility and required no rest breaks.  Pt noted R trendelenberg gait and ankle instability during ambulation, indicative of a fall risk during dynamic gait.  PT educated pt concerning importance of maintaining HEP when performing home therapy and for increase in strength and balance.  Pt will benefit from skilled PT with focus on strength, balance, functional mobility and proper use of AD.    Follow Up Recommendations Home health PT    Equipment Recommendations       Recommendations for Other Services       Precautions / Restrictions Precautions Precautions: Fall Restrictions Weight Bearing Restrictions: No      Mobility  Bed Mobility Overal bed mobility: (Pt in chair upon PT arrival.  Unable to assess.)                Transfers Overall transfer level: Modified independent Equipment used: Rolling walker (2 wheeled)                Ambulation/Gait Ambulation/Gait assistance: Supervision Ambulation Distance (Feet): 220 Feet Assistive device: Rolling walker (2 wheeled)     Gait velocity interpretation: at or above normal speed for age/gender General Gait Details: Pt demonstrated a R Trendelenberg gait in R stance phase, ankle instability bilat, good foot clearance and step length.  PT provided education concerning safe use of  RW and proper adjustment for height.  Pt demonstrated understanding.  Stairs            Wheelchair Mobility    Modified Rankin (Stroke Patients Only)       Balance Overall balance assessment: Modified Independent                                           Pertinent Vitals/Pain Pain Assessment: No/denies pain    Home Living Family/patient expects to be discharged to:: Private residence Living Arrangements: Spouse/significant other Available Help at Discharge: Family Type of Home: Apartment Home Access: Level entry     Home Layout: One level Home Equipment: Grab bars - tub/shower;Walker - 2 wheels;Cane - single point;Shower seat - built in      Prior Function Level of Independence: Independent with assistive device(s)         Comments: Sometimes uses RW or SPC depending on how he feels.     Hand Dominance   Dominant Hand: Right    Extremity/Trunk Assessment   Upper Extremity Assessment Upper Extremity Assessment: Overall WFL for tasks assessed    Lower Extremity Assessment Lower Extremity Assessment: Overall WFL for tasks assessed    Cervical / Trunk Assessment Cervical / Trunk Assessment: Kyphotic  Communication      Cognition Arousal/Alertness: Awake/alert Behavior During Therapy: WFL for tasks  assessed/performed Overall Cognitive Status: Within Functional Limits for tasks assessed                                        General Comments      Exercises     Assessment/Plan    PT Assessment Patient needs continued PT services  PT Problem List Decreased strength;Decreased mobility;Decreased activity tolerance;Decreased balance;Decreased knowledge of use of DME;Pain       PT Treatment Interventions DME instruction;Therapeutic activities;Gait training;Therapeutic exercise;Stair training;Balance training;Functional mobility training;Patient/family education    PT Goals (Current goals can be found in the Care  Plan section)  Acute Rehab PT Goals Patient Stated Goal: To return home and continue to regain strength. PT Goal Formulation: With patient Time For Goal Achievement: 01/16/18 Potential to Achieve Goals: Good    Frequency Min 2X/week   Barriers to discharge        Co-evaluation               AM-PAC PT "6 Clicks" Daily Activity  Outcome Measure Difficulty turning over in bed (including adjusting bedclothes, sheets and blankets)?: A Little Difficulty moving from lying on back to sitting on the side of the bed? : A Little Difficulty sitting down on and standing up from a chair with arms (e.g., wheelchair, bedside commode, etc,.)?: A Little Help needed moving to and from a bed to chair (including a wheelchair)?: A Little Help needed walking in hospital room?: A Little Help needed climbing 3-5 steps with a railing? : A Little 6 Click Score: 18    End of Session Equipment Utilized During Treatment: Gait belt Activity Tolerance: Patient tolerated treatment well Patient left: in chair;with call bell/phone within reach;with chair alarm set   PT Visit Diagnosis: Unsteadiness on feet (R26.81);Muscle weakness (generalized) (M62.81);Pain Pain - part of body: (Low back)    Time: 1330-1350 PT Time Calculation (min) (ACUTE ONLY): 20 min   Charges:   PT Evaluation $PT Eval Low Complexity: 1 Low PT Treatments $Gait Training: 8-22 mins   PT G Codes:   PT G-Codes **NOT FOR INPATIENT CLASS** Functional Assessment Tool Used: AM-PAC 6 Clicks Basic Mobility    Glenetta HewSarah Analei Whinery, PT, DPT   Glenetta HewSarah Laprecious Austill 01/02/2018, 2:07 PM

## 2018-01-02 NOTE — Care Management Note (Signed)
Case Management Note  Patient Details  Name: Jamie Parsons. MRN: [id number] Date of Birth: Mar 06, 1941  Subjective/Objective:   Met with patient at bedside. Offered choice of home health agencies. Referral to [facility] with [facility]. Patient has a walker. Lives with his wife.  PCP is Dr. Dorann Ou.                 Action/Plan:   Expected Discharge Date:  01/02/18               Expected Discharge Plan:  [place]  In-House Referral:     Discharge planning Services  CM Consult  Post Acute Care Choice:  Home Health Choice offered to:  Patient  DME Arranged:    DME Agency:     HH Arranged:  PT [place]:  [place]  Status of Service:  Completed, signed off  If discussed at [place] of Stay Meetings, dates discussed:    Additional Comments:  Jolly Mango, RN 01/02/2018, 2:20 PM

## 2018-01-03 ENCOUNTER — Emergency Department: Payer: PPO

## 2018-01-03 DIAGNOSIS — M25551 Pain in right hip: Secondary | ICD-10-CM | POA: Diagnosis not present

## 2018-01-03 LAB — COMPREHENSIVE METABOLIC PANEL
ALT: 19 U/L (ref 17–63)
AST: 28 U/L (ref 15–41)
Albumin: 3.4 g/dL — ABNORMAL LOW (ref 3.5–5.0)
Alkaline Phosphatase: 75 U/L (ref 38–126)
Anion gap: 7 (ref 5–15)
BILIRUBIN TOTAL: 0.3 mg/dL (ref 0.3–1.2)
BUN: 22 mg/dL — AB (ref 6–20)
CALCIUM: 8.8 mg/dL — AB (ref 8.9–10.3)
CO2: 30 mmol/L (ref 22–32)
CREATININE: 1.05 mg/dL (ref 0.61–1.24)
Chloride: 98 mmol/L — ABNORMAL LOW (ref 101–111)
GFR calc Af Amer: >60 mL/min (ref >60)
Glucose, Bld: 114 mg/dL — ABNORMAL HIGH (ref 65–99)
POTASSIUM: 4.4 mmol/L (ref 3.5–5.1)
Sodium: 135 mmol/L (ref 135–145)
TOTAL PROTEIN: 6.9 g/dL (ref 6.5–8.1)

## 2018-01-03 LAB — CBC WITH DIFFERENTIAL/PLATELET
BASOS ABS: 0 10*3/uL (ref 0–0.1)
Basophils Relative: 0 %
Eosinophils Absolute: 0 10*3/uL (ref 0–0.7)
Eosinophils Relative: 0 %
HEMATOCRIT: 35.7 % — AB (ref 40.0–52.0)
Hemoglobin: 12.2 g/dL — ABNORMAL LOW (ref 13.0–18.0)
LYMPHS PCT: 4 %
Lymphs Abs: 0.3 10*3/uL — ABNORMAL LOW (ref 1.0–3.6)
MCH: 30.9 pg (ref 26.0–34.0)
MCHC: 34.1 g/dL (ref 32.0–36.0)
MCV: 90.6 fL (ref 80.0–100.0)
MONO ABS: 1.3 10*3/uL — AB (ref 0.2–1.0)
MONOS PCT: 15 %
NEUTROS ABS: 7.2 10*3/uL — AB (ref 1.4–6.5)
Neutrophils Relative %: 81 %
Platelets: 213 10*3/uL (ref 150–440)
RBC: 3.94 MIL/uL — ABNORMAL LOW (ref 4.40–5.90)
RDW: 13.6 % (ref 11.5–14.5)
WBC: 8.9 10*3/uL (ref 3.8–10.6)

## 2018-01-03 LAB — TROPONIN I

## 2018-01-03 MED ORDER — LISINOPRIL 10 MG PO TABS
10.0000 mg | ORAL_TABLET | Freq: Every day | ORAL | Status: DC
  Administered 2018-01-03: 10 mg via ORAL
  Filled 2018-01-03: qty 1

## 2018-01-03 MED ORDER — PHENYTOIN SODIUM EXTENDED 100 MG PO CAPS
100.0000 mg | ORAL_CAPSULE | Freq: Three times a day (TID) | ORAL | Status: DC
  Administered 2018-01-03: 100 mg via ORAL
  Filled 2018-01-03: qty 1

## 2018-01-03 MED ORDER — ALBUTEROL SULFATE (2.5 MG/3ML) 0.083% IN NEBU: 2.5000 mg | INHALATION_SOLUTION | RESPIRATORY_TRACT | Status: DC | PRN

## 2018-01-03 MED ORDER — DONEPEZIL HCL 5 MG PO TABS: 5.0000 mg | ORAL_TABLET | Freq: Every day | ORAL | Status: DC

## 2018-01-03 MED ORDER — AMITRIPTYLINE HCL 50 MG PO TABS
150.0000 mg | ORAL_TABLET | Freq: Two times a day (BID) | ORAL | Status: DC
  Administered 2018-01-03: 150 mg via ORAL
  Filled 2018-01-03: qty 3

## 2018-01-03 MED ORDER — ATENOLOL 25 MG PO TABS
25.0000 mg | ORAL_TABLET | Freq: Every day | ORAL | Status: DC
  Administered 2018-01-03: 25 mg via ORAL
  Filled 2018-01-03: qty 1

## 2018-01-03 MED ORDER — OXYCODONE HCL 5 MG PO TABS: 5.0000 mg | ORAL_TABLET | Freq: Four times a day (QID) | ORAL | Status: DC | PRN

## 2018-01-03 MED ORDER — CEPHALEXIN 250 MG PO CAPS
250.0000 mg | ORAL_CAPSULE | Freq: Three times a day (TID) | ORAL | Status: DC
  Administered 2018-01-03: 250 mg via ORAL
  Filled 2018-01-03 (×4): qty 1

## 2018-01-03 MED ORDER — CLOPIDOGREL BISULFATE 75 MG PO TABS
75.0000 mg | ORAL_TABLET | Freq: Every day | ORAL | Status: DC
  Administered 2018-01-03: 75 mg via ORAL
  Filled 2018-01-03: qty 1

## 2018-01-03 NOTE — ED Notes (Addendum)
Pt alert and oriented X4, active, cooperative, pt in NAD. RR even and unlabored, color WNL.  Pt informed to return if any life threatening symptoms occur.  Discharge and followup instructions reviewed. Pt left with brother.  Tranfers from bed to walker and to wheelchair by stand by assistance.  Patient left with all of belongings in room which included, red socks, pants, shirt, glasses, set of car keys and previous discharge papers. Pt wearing back brace at time of discharge.

## 2018-01-03 NOTE — ED Notes (Signed)
PT to bedside at this time 

## 2018-01-03 NOTE — ED Notes (Signed)
Family at bedside requesting to speak with EDP. EDP notified.

## 2018-01-03 NOTE — ED Notes (Signed)
Spoke with PT again, states they are reccommending that patient go home and continue with home health. Brother taking patient home.

## 2018-01-03 NOTE — ED Notes (Signed)
Ambulated pt around room.

## 2018-01-03 NOTE — ED Notes (Signed)
Given lunch tray.

## 2018-01-03 NOTE — ED Notes (Signed)
Bed alarm placed on pt at this time 

## 2018-01-03 NOTE — ED Provider Notes (Signed)
-----------------------------------------   1:17 PM on 01/03/2018 -----------------------------------------  Patient has been seen by physical therapy, ambulated well with physical therapy they are recommending discharge home with home health.  Home health has already been established in the past.  We will discharge from the emergency department.  I discussed this with the family and patient they are agreeable to this plan of care.   Minna AntisPaduchowski, Milan Clare, MD 01/03/18 1318

## 2018-01-03 NOTE — ED Notes (Signed)
Bed alarm on patient, high fall risk band on, call bell within reach, visible from nurses station. Instructed to hip call bell when needing assistance and not get up unassisted.

## 2018-01-03 NOTE — Discharge Summary (Signed)
[facility] [place] [facility]   PATIENT NAME: Jamie Parsons    MR#:  [id number]  DATE OF BIRTH:  Aug 06, 1941  DATE OF ADMISSION:  12/31/2017 ADMITTING PHYSICIAN: Milagros LollSrikar Camyla Camposano, MD  DATE OF DISCHARGE: 01/02/2018  4:00 PM  PRIMARY CARE PHYSICIAN: [place], [facility]   ADMISSION DIAGNOSIS:  Urinary tract infection without hematuria, site unspecified [N39.0] Closed compression fracture of second lumbar vertebra, initial encounter (HCC) [S32.020A]  DISCHARGE DIAGNOSIS:  Active Problems:   UTI (urinary tract infection)   SECONDARY DIAGNOSIS:   Past Medical History:  Diagnosis Date  . COPD (chronic obstructive pulmonary disease) (HCC)   . Seizures (HCC)   . Stroke [facility](HCC)      ADMITTING HISTORY  HISTORY OF PRESENT ILLNESS:  Jamie HookDavid Popoca  is a 77 y.o. male with a known history of COPD, hypertension, gait abnormalities, CVA presents to the hospital brought in by family due to low back pain, weakness and unable to ambulate.  Patient fell 2 days back while trying to get off his chair.  Has had low back pain on the right side.  Here in the emergency room he has been found to have acute L2 compression fracture along with UTI inability to walk and patient is being admitted to the hospitalist service. At baseline he uses a cane or walker depending on how strong he feels.  He does not use home oxygen.  HOSPITAL COURSE:   *Klebsiella UTI *Generalized weakness *Acute L2 compression fracture *Fall  Patient was admitted to medical floor and started on IV antibiotics.  Responded well.  Afebrile with normal WBC in the hospital.  Started on pain medications as needed for his low back pain from the L2 compression fracture.  Orthopedics saw the patient and suggested conservative management.  Patient's pain is significantly improved with corset and he has worked with physical therapy.  Home health is being set up.  Discharged home on oral antibiotics.  Stable for  discharge home.  CONSULTS OBTAINED:    DRUG ALLERGIES:   Allergies  Allergen Reactions  . Penicillins Other (See Comments)    Pt states that this medication "makes him crazy".   Has patient had a PCN reaction causing immediate rash, facial/tongue/throat swelling, SOB or lightheadedness with hypotension: No Has patient had a PCN reaction causing severe rash involving mucus membranes or skin necrosis: No Has patient had a PCN reaction that required hospitalization No Has patient had a PCN reaction occurring within the last 10 years: No If all of the above answers are "NO", then may proceed with Cephalosporin use.    DISCHARGE MEDICATIONS:   Allergies as of 01/02/2018      Reactions   Penicillins Other (See Comments)   Pt states that this medication "makes him crazy".   Has patient had a PCN reaction causing immediate rash, facial/tongue/throat swelling, SOB or lightheadedness with hypotension: No Has patient had a PCN reaction causing severe rash involving mucus membranes or skin necrosis: No Has patient had a PCN reaction that required hospitalization No Has patient had a PCN reaction occurring within the last 10 years: No If all of the above answers are "NO", then may proceed with Cephalosporin use.      Medication List    STOP taking these medications   predniSONE 10 MG (21) Tbpk tablet Commonly known as:  STERAPRED UNI-PAK 21 TAB     TAKE these medications   acetaminophen 325 MG tablet Commonly known as:  TYLENOL Take 325-650 mg by  mouth every 6 (six) hours as needed for mild pain or headache.   amitriptyline 50 MG tablet Commonly known as:  ELAVIL Take 150 mg by mouth 2 (two) times daily. Pt takes one tablet in the afternoon and two at bedtime.   atenolol 25 MG tablet Commonly known as:  TENORMIN Take 25 mg by mouth daily.   cephALEXin 250 MG capsule Commonly known as:  KEFLEX Take 1 capsule (250 mg total) by mouth 3 (three) times daily for 3 days.    clopidogrel 75 MG tablet Commonly known as:  PLAVIX Take 75 mg by mouth daily.   donepezil 5 MG tablet Commonly known as:  ARICEPT Take 5 mg by mouth at bedtime.   lisinopril 10 MG tablet Commonly known as:  PRINIVIL,ZESTRIL Take 10 mg by mouth daily.   oxyCODONE 5 MG immediate release tablet Commonly known as:  Oxy IR/ROXICODONE Take 1 tablet (5 mg total) by mouth every 6 (six) hours as needed for severe pain.   phenytoin 100 MG ER capsule Commonly known as:  DILANTIN Take 100 mg by mouth 3 (three) times daily.   PROAIR HFA 108 (90 Base) MCG/ACT inhaler Generic drug:  albuterol Inhale 1-2 puffs into the lungs every 4 (four) hours as needed.       Today   VITAL SIGNS:  Blood pressure 127/65, pulse 88, temperature 98 F (36.7 C), temperature source Oral, resp. rate 18, height 6\' 3"  (1.905 m), weight 68.1 kg (150 lb 2.1 oz), SpO2 95 %.  I/O:  No intake or output data in the 24 hours ending 01/03/18 1720  PHYSICAL EXAMINATION:  Physical Exam  GENERAL:  77 y.o.-year-old patient lying in the bed with no acute distress.  LUNGS: Normal breath sounds bilaterally, no wheezing, rales,rhonchi or crepitation. No use of accessory muscles of respiration.  CARDIOVASCULAR: S1, S2 normal. No murmurs, rubs, or gallops.  ABDOMEN: Soft, non-tender, non-distended. Bowel sounds present. No organomegaly or mass.  NEUROLOGIC: Moves all 4 extremities. PSYCHIATRIC: The patient is alert and oriented x 3.  SKIN: No obvious rash, lesion, or ulcer.   DATA REVIEW:   CBC Recent Labs  Lab 01/02/18 2354  WBC 8.9  HGB 12.2*  HCT 35.7*  PLT 213    Chemistries  Recent Labs  Lab 01/02/18 2354  NA 135  K 4.4  CL 98*  CO2 30  GLUCOSE 114*  BUN 22*  CREATININE 1.05  CALCIUM 8.8*  AST 28  ALT 19  ALKPHOS 75  BILITOT 0.3    Cardiac Enzymes Recent Labs  Lab 01/02/18 2354  TROPONINI <0.03    Microbiology Results  Results for orders placed or performed during the hospital  encounter of 12/31/17  Urine Culture     Status: Abnormal (Preliminary result)   Collection Time: 12/31/17  3:14 PM  Result Value Ref Range Status   Specimen Description   Final    URINE, RANDOM Performed at [facility], [address] [place], [place] [address]    Special Requests   Final    ceftriaxone Performed at [facility], [address] [place], [place] [address]    Culture (A)  Final    >=100,000 COLONIES/mL KLEBSIELLA OXYTOCA SUSCEPTIBILITIES TO FOLLOW Performed at [facility], [address], [place], [place] [address]    Report Status PENDING  Incomplete    RADIOLOGY:  Dg Chest 1 View  Result Date: 01/03/2018 CLINICAL DATA:  Right hip pain after a fall. Wheezing in the chest. Patient  was seen in hospital on Saturday for cracked vertebra after a fall. EXAM: CHEST 1 VIEW COMPARISON:  12/31/2017 FINDINGS: Pulmonary hyperinflation. The heart size and mediastinal contours are within normal limits. Both lungs are clear. The visualized skeletal structures are unremarkable. IMPRESSION: No active disease. Electronically Signed   By: Burman Nieves M.D.   On: 01/03/2018 00:00   Ct Hip Right Wo Contrast  Result Date: 01/03/2018 CLINICAL DATA:  Right hip pain after a fall. EXAM: CT OF THE RIGHT HIP WITHOUT CONTRAST TECHNIQUE: Multidetector CT imaging of the right hip was performed according to the standard protocol. Multiplanar CT image reconstructions were also generated. COMPARISON:  Right hip radiographs 01/02/2018. CT abdomen and pelvis 11/05/2005 FINDINGS: Bones/Joint/Cartilage Degenerative changes in the right hip with narrowed acetabular joint and hypertrophic changes on the acetabular and femoral surfaces. Mild cartilaginous calcification. No evidence of acute fracture or dislocation of the right hip. No focal bone lesion or bone destruction. Ligaments Suboptimally assessed by CT. Muscles and Tendons Limited evaluation on CT. No evidence of  intramuscular mass, hematoma, or infiltration. Soft tissues No soft tissue mass, fluid collection, or infiltration. No significant joint effusion. Arterial vascular calcifications. IMPRESSION: Degenerative changes in the right hip. No evidence of acute fracture or dislocation. Electronically Signed   By: Burman Nieves M.D.   On: 01/03/2018 02:45   Dg Hip Unilat With Pelvis 2-3 Views Right  Result Date: 01/03/2018 CLINICAL DATA:  Right hip pain after a fall. EXAM: DG HIP (WITH OR WITHOUT PELVIS) 2-3V RIGHT COMPARISON:  12/31/2017 FINDINGS: Degenerative changes in the lower lumbar spine and in both hips. Pelvis and sacrum appear intact. SI joints and symphysis pubis are not displaced. Right hip appears intact. No evidence of acute fracture or dislocation. No focal bone lesion or bone destruction. Soft tissues are unremarkable. Vascular calcifications. IMPRESSION: Degenerative changes in the lower lumbar spine and in both hips. No acute displaced fractures identified. Electronically Signed   By: Burman Nieves M.D.   On: 01/03/2018 00:02    Follow up with PCP in 1 week.  Management plans discussed with the patient, family and they are in agreement.  CODE STATUS:  Code Status History    Date Active Date Inactive Code Status Order ID Comments User Context   01/02/2018 09:11 01/02/2018 19:08 DNR [id number]  [name], MD Inpatient   12/31/2017 17:37 01/02/2018 09:11 Full Code [id number]  [name], MD ED    Questions for Most Recent Historical Code Status (Order [id number])    Question Answer Comment   In the event of cardiac or respiratory ARREST Do not call a "code blue"    In the event of cardiac or respiratory ARREST Do not perform Intubation, CPR, defibrillation or ACLS    In the event of cardiac or respiratory ARREST Use medication by any route, position, wound care, and other measures to relive pain and suffering. May use oxygen, suction and manual treatment of airway obstruction as  needed for comfort.       TOTAL TIME TAKING CARE OF THIS PATIENT ON DAY OF DISCHARGE: more than 30 minutes.   Molinda Bailiff Raylee Strehl M.D on 01/03/2018 at 5:20 PM  Between 7am to 6pm - Pager - [phone]  After 6pm go to [email or web address] - password [email or web address]  [facility]  Office  [phone]  CC: Primary care physician; [facility]  Note: This dictation was prepared with Dragon dictation along with smaller phrase technology. Any transcriptional errors that result from this process  are unintentional.

## 2018-01-03 NOTE — Clinical Social Work Note (Signed)
CSW consulted for "Patient d/c'd home with home health for L2 compression fracture. Had another fall, now with right hip pain. Very unsteady using walker." P/T recommending home with home health physical therapy. Home health already established from previous admission. EDP aware and spoke with patient and family-both agreeable to discharge plan. CSW signing off as no further Social Work needs identified.   Corlis HoveJeneya Keylie Beavers, Theresia MajorsLCSWA, [facility] Clinical Social Worker-ED [phone]

## 2018-01-03 NOTE — ED Notes (Signed)
PT at bedside to assess.

## 2018-01-03 NOTE — Evaluation (Signed)
Physical Therapy Evaluation Patient Details Name: Jamie Parsons. MRN: [id number] DOB: 05-28-1941 Today's Date: 01/03/2018   History of Present Illness  77 y/o male who was discharged 1/30, he had walked 200 ft (with new back brace secondary to L2 compression fx), he had a dizzy spell and fell at home hurting R hip  PMH includes COPD, Htn, gait abnormality, CVA and seizures.  Clinical Impression  Pt with fall at homeshortly after recent discharge with R hip pain (imaging negative).  He was able to walk ~100 ft with walker and get to/from supine/sit/standing w/o direct assist.  He is normally able to be very active but with recent L2 compression fx and current need for AD they were unsure if he could be safe at home.  After assessment and discussion with pt/wife/neighbor they are confident that he will be able to go home and they will have the assistance they need for errands, groceries, etc until he is better.  HHPT was scheduled after d/c yesterday, will still require HHPT per today's assessment.      Follow Up Recommendations Home health PT    Equipment Recommendations       Recommendations for Other Services       Precautions / Restrictions Precautions Precautions: Fall Required Braces or Orthoses: Spinal Brace Spinal Brace: Lumbar corset Restrictions Weight Bearing Restrictions: No      Mobility  Bed Mobility Overal bed mobility: Modified Independent             General bed mobility comments: Pt needed light use of rails in/out of bed, but did not need direct assist or excessive time  Transfers Overall transfer level: Modified independent Equipment used: Rolling walker (2 wheeled)             General transfer comment: lifted bed to standard height, pt able to rise w/o direct assist.  Needed walker to maintain balance.   Ambulation/Gait Ambulation/Gait assistance: Supervision Ambulation Distance (Feet): 100 Feet Assistive device: Rolling walker (2  wheeled)       General Gait Details: Pt w/o excessive limp/favoring of the R, did rely heavily on the walker but had no LOBs or safety issues.  O2 remained in the 90s, c/o only mild increased pain/fatigue  Stairs            Wheelchair Mobility    Modified Rankin (Stroke Patients Only)       Balance Overall balance assessment: Modified Independent                                           Pertinent Vitals/Pain Pain Assessment: 0-10 Pain Score: 6  Pain Location: c/o more hip pain than back pain    Home Living Family/patient expects to be discharged to:: Private residence Living Arrangements: Spouse/significant other Available Help at Discharge: Family;Friend(s) Type of Home: Apartment Home Access: Level entry     Home Layout: One level Home Equipment: Grab bars - tub/shower;Walker - 2 wheels;Cane - single point;Shower seat - built in      Prior Function Level of Independence: Independent with assistive device(s)         Comments: Pt drives, runs errands, generally can be very active.  Occasional AD use, this is his second fall in the last week     Hand Dominance        Extremity/Trunk Assessment   Upper Extremity Assessment Upper  Extremity Assessment: Overall WFL for tasks assessed;Generalized weakness    Lower Extremity Assessment Lower Extremity Assessment: Overall WFL for tasks assessed;Generalized weakness    Cervical / Trunk Assessment Cervical / Trunk Assessment: Kyphotic  Communication   Communication: No difficulties;HOH  Cognition Arousal/Alertness: Awake/alert Behavior During Therapy: WFL for tasks assessed/performed Overall Cognitive Status: Within Functional Limits for tasks assessed                                        General Comments      Exercises     Assessment/Plan    PT Assessment Patient needs continued PT services  PT Problem List Decreased strength;Decreased mobility;Decreased  activity tolerance;Decreased balance;Decreased knowledge of use of DME;Pain       PT Treatment Interventions DME instruction;Therapeutic activities;Gait training;Therapeutic exercise;Stair training;Balance training;Functional mobility training;Patient/family education    PT Goals (Current goals can be found in the Care Plan section)  Acute Rehab PT Goals Patient Stated Goal: To return home and continue to regain strength. PT Goal Formulation: With patient Time For Goal Achievement: 01/16/18 Potential to Achieve Goals: Good    Frequency Min 2X/week   Barriers to discharge        Co-evaluation               AM-PAC PT "6 Clicks" Daily Activity  Outcome Measure Difficulty turning over in bed (including adjusting bedclothes, sheets and blankets)?: A Little Difficulty moving from lying on back to sitting on the side of the bed? : A Little Difficulty sitting down on and standing up from a chair with arms (e.g., wheelchair, bedside commode, etc,.)?: A Little Help needed moving to and from a bed to chair (including a wheelchair)?: None Help needed walking in hospital room?: A Little Help needed climbing 3-5 steps with a railing? : A Little 6 Click Score: 19    End of Session Equipment Utilized During Treatment: Gait belt Activity Tolerance: Patient limited by fatigue;Patient tolerated treatment well Patient left: with family/visitor present;in bed;with call bell/phone within reach Nurse Communication: Mobility status PT Visit Diagnosis: Unsteadiness on feet (R26.81);Muscle weakness (generalized) (M62.81);Pain Pain - Right/Left: Right Pain - part of body: Hip    Time: [id number] PT Time Calculation (min) (ACUTE ONLY): 31 min   Charges:   PT Evaluation $PT Eval Low Complexity: 1 Low     PT G CodesMalachi Parsons:        Jamie Parsons, DPT 01/03/2018, 1:57 PM

## 2018-01-03 NOTE — ED Notes (Signed)
ED Provider at bedside. 

## 2018-01-03 NOTE — ED Notes (Addendum)
Attempted to ambulate patient to toilet, pt able to stand but weak and complains of right hip pain, does not wish to attempt to ambulate and sits back on bed.

## 2018-01-03 NOTE — ED Notes (Signed)
Breakfast tray provided to pt at this time.  

## 2018-01-03 NOTE — ED Notes (Addendum)
Pt states he is here for right hip pain following a mechanical fall at home. Pt reports no LOC or head injury. Pt alert and oriented x 4. Pt also states seeing blood in his urine when he goes to bathroom. PT has two sets of glasses at the bedside.

## 2018-01-03 NOTE — Progress Notes (Signed)
Chaplain was following up with pat. Pt was eating Chaplain will follow up.    01/03/18 1000  Clinical Encounter Type  Visited With Patient  Visit Type Follow-up  Referral From Nurse  Spiritual Encounters  Spiritual Needs Emotional

## 2018-01-04 DIAGNOSIS — S32020D Wedge compression fracture of second lumbar vertebra, subsequent encounter for fracture with routine healing: Secondary | ICD-10-CM | POA: Diagnosis not present

## 2018-01-04 DIAGNOSIS — R2689 Other abnormalities of gait and mobility: Secondary | ICD-10-CM | POA: Diagnosis not present

## 2018-01-04 DIAGNOSIS — Z9181 History of falling: Secondary | ICD-10-CM | POA: Diagnosis not present

## 2018-01-04 DIAGNOSIS — Z7902 Long term (current) use of antithrombotics/antiplatelets: Secondary | ICD-10-CM | POA: Diagnosis not present

## 2018-01-04 DIAGNOSIS — J449 Chronic obstructive pulmonary disease, unspecified: Secondary | ICD-10-CM | POA: Diagnosis not present

## 2018-01-04 DIAGNOSIS — I10 Essential (primary) hypertension: Secondary | ICD-10-CM | POA: Diagnosis not present

## 2018-01-04 DIAGNOSIS — I69398 Other sequelae of cerebral infarction: Secondary | ICD-10-CM | POA: Diagnosis not present

## 2018-01-04 DIAGNOSIS — N390 Urinary tract infection, site not specified: Secondary | ICD-10-CM | POA: Diagnosis not present

## 2018-01-04 LAB — URINE CULTURE: Culture: >=100000 — AB

## 2018-01-10 DIAGNOSIS — S32020A Wedge compression fracture of second lumbar vertebra, initial encounter for closed fracture: Secondary | ICD-10-CM | POA: Diagnosis not present

## 2018-01-10 DIAGNOSIS — R42 Dizziness and giddiness: Secondary | ICD-10-CM | POA: Diagnosis not present

## 2018-01-14 DIAGNOSIS — S32020A Wedge compression fracture of second lumbar vertebra, initial encounter for closed fracture: Secondary | ICD-10-CM | POA: Diagnosis not present

## 2018-01-14 DIAGNOSIS — M545 Low back pain: Secondary | ICD-10-CM | POA: Diagnosis not present

## 2018-01-17 ENCOUNTER — Ambulatory Visit
Admission: RE | Admit: 2018-01-17 | Discharge: 2018-01-17 | Disposition: A | Discharge status: Home / Self Care | Payer: PPO | Source: Ambulatory Visit | Attending: Orthopedic Surgery | Admitting: Orthopedic Surgery

## 2018-01-17 ENCOUNTER — Other Ambulatory Visit: Payer: Self-pay

## 2018-01-17 ENCOUNTER — Ambulatory Visit: Payer: PPO | Admitting: Anesthesiology

## 2018-01-17 ENCOUNTER — Encounter: Payer: Self-pay | Admitting: Anesthesiology

## 2018-01-17 ENCOUNTER — Encounter
Admission: RE | Disposition: A | Discharge status: Home / Self Care | Payer: Self-pay | Source: Ambulatory Visit | Attending: Orthopedic Surgery

## 2018-01-17 ENCOUNTER — Ambulatory Visit: Payer: PPO

## 2018-01-17 DIAGNOSIS — R569 Unspecified convulsions: Secondary | ICD-10-CM | POA: Diagnosis not present

## 2018-01-17 DIAGNOSIS — S32020A Wedge compression fracture of second lumbar vertebra, initial encounter for closed fracture: Secondary | ICD-10-CM | POA: Diagnosis not present

## 2018-01-17 DIAGNOSIS — Z981 Arthrodesis status: Secondary | ICD-10-CM | POA: Diagnosis not present

## 2018-01-17 DIAGNOSIS — J449 Chronic obstructive pulmonary disease, unspecified: Secondary | ICD-10-CM | POA: Insufficient documentation

## 2018-01-17 DIAGNOSIS — Z79891 Long term (current) use of opiate analgesic: Secondary | ICD-10-CM | POA: Diagnosis not present

## 2018-01-17 DIAGNOSIS — I10 Essential (primary) hypertension: Secondary | ICD-10-CM | POA: Diagnosis not present

## 2018-01-17 DIAGNOSIS — Z7902 Long term (current) use of antithrombotics/antiplatelets: Secondary | ICD-10-CM | POA: Insufficient documentation

## 2018-01-17 DIAGNOSIS — T1490XS Injury, unspecified, sequela: Secondary | ICD-10-CM | POA: Insufficient documentation

## 2018-01-17 DIAGNOSIS — X58XXXA Exposure to other specified factors, initial encounter: Secondary | ICD-10-CM | POA: Diagnosis not present

## 2018-01-17 DIAGNOSIS — Z7951 Long term (current) use of inhaled steroids: Secondary | ICD-10-CM | POA: Insufficient documentation

## 2018-01-17 DIAGNOSIS — Z79899 Other long term (current) drug therapy: Secondary | ICD-10-CM | POA: Insufficient documentation

## 2018-01-17 DIAGNOSIS — G629 Polyneuropathy, unspecified: Secondary | ICD-10-CM | POA: Diagnosis not present

## 2018-01-17 DIAGNOSIS — Z8673 Personal history of transient ischemic attack (TIA), and cerebral infarction without residual deficits: Secondary | ICD-10-CM | POA: Diagnosis not present

## 2018-01-17 DIAGNOSIS — F329 Major depressive disorder, single episode, unspecified: Secondary | ICD-10-CM | POA: Diagnosis not present

## 2018-01-17 DIAGNOSIS — W3400XS Accidental discharge from unspecified firearms or gun, sequela: Secondary | ICD-10-CM | POA: Diagnosis not present

## 2018-01-17 DIAGNOSIS — Z419 Encounter for procedure for purposes other than remedying health state, unspecified: Secondary | ICD-10-CM

## 2018-01-17 HISTORY — DX: Essential (primary) hypertension: I10

## 2018-01-17 HISTORY — DX: Gastro-esophageal reflux disease without esophagitis: K21.9

## 2018-01-17 HISTORY — DX: Accidental discharge from unspecified firearms or gun, initial encounter: W34.00XA

## 2018-01-17 HISTORY — DX: Depression, unspecified: F32.A

## 2018-01-17 HISTORY — DX: Major depressive disorder, single episode, unspecified: F32.9

## 2018-01-17 HISTORY — PX: KYPHOPLASTY: SHX5884

## 2018-01-17 HISTORY — DX: Hyperlipidemia, unspecified: E78.5

## 2018-01-17 SURGERY — KYPHOPLASTY
Anesthesia: Monitor Anesthesia Care | Wound class: Clean

## 2018-01-17 MED ORDER — PROPOFOL 500 MG/50ML IV EMUL
INTRAVENOUS | Status: DC | PRN
  Administered 2018-01-17: 35 ug/kg/min via INTRAVENOUS

## 2018-01-17 MED ORDER — KETAMINE HCL 50 MG/ML IJ SOLN
INTRAMUSCULAR | Status: AC
  Filled 2018-01-17: qty 10

## 2018-01-17 MED ORDER — FAMOTIDINE 20 MG PO TABS
20.0000 mg | ORAL_TABLET | Freq: Once | ORAL | Status: AC
  Administered 2018-01-17: 20 mg via ORAL

## 2018-01-17 MED ORDER — BUPIVACAINE-EPINEPHRINE (PF) 0.5% -1:200000 IJ SOLN
INTRAMUSCULAR | Status: AC
  Filled 2018-01-17: qty 30

## 2018-01-17 MED ORDER — ATENOLOL 50 MG PO TABS
25.0000 mg | ORAL_TABLET | Freq: Once | ORAL | Status: AC
  Administered 2018-01-17: 25 mg via ORAL

## 2018-01-17 MED ORDER — FAMOTIDINE 20 MG PO TABS
ORAL_TABLET | ORAL | Status: AC
  Filled 2018-01-17: qty 1

## 2018-01-17 MED ORDER — ATENOLOL 50 MG PO TABS
ORAL_TABLET | ORAL | Status: AC
  Filled 2018-01-17: qty 1

## 2018-01-17 MED ORDER — METOCLOPRAMIDE HCL 10 MG PO TABS: 5.0000 mg | ORAL_TABLET | Freq: Three times a day (TID) | ORAL | Status: DC | PRN

## 2018-01-17 MED ORDER — LIDOCAINE HCL (PF) 1 % IJ SOLN
INTRAMUSCULAR | Status: AC
  Filled 2018-01-17: qty 30

## 2018-01-17 MED ORDER — IOPAMIDOL (ISOVUE-M 200) INJECTION 41%
INTRAMUSCULAR | Status: AC
  Filled 2018-01-17: qty 10

## 2018-01-17 MED ORDER — METOCLOPRAMIDE HCL 5 MG/ML IJ SOLN: 5.0000 mg | Freq: Three times a day (TID) | INTRAMUSCULAR | Status: DC | PRN

## 2018-01-17 MED ORDER — ONDANSETRON HCL 4 MG/2ML IJ SOLN
4.0000 mg | Freq: Once | INTRAMUSCULAR | Status: DC | PRN
Start: 2018-01-17 — End: 2018-01-17

## 2018-01-17 MED ORDER — LACTATED RINGERS IV SOLN
INTRAVENOUS | Status: DC
  Administered 2018-01-17: 12:00:00 via INTRAVENOUS

## 2018-01-17 MED ORDER — HYDROCODONE-ACETAMINOPHEN 5-325 MG PO TABS: 1.0000 | ORAL_TABLET | ORAL | Status: DC | PRN

## 2018-01-17 MED ORDER — CLINDAMYCIN PHOSPHATE 900 MG/50ML IV SOLN
INTRAVENOUS | Status: AC
  Filled 2018-01-17: qty 50

## 2018-01-17 MED ORDER — ONDANSETRON HCL 4 MG PO TABS: 4.0000 mg | ORAL_TABLET | Freq: Four times a day (QID) | ORAL | Status: DC | PRN

## 2018-01-17 MED ORDER — MIDAZOLAM HCL 2 MG/2ML IJ SOLN
INTRAMUSCULAR | Status: AC
  Filled 2018-01-17: qty 2

## 2018-01-17 MED ORDER — PROPOFOL 500 MG/50ML IV EMUL
INTRAVENOUS | Status: AC
  Filled 2018-01-17: qty 50

## 2018-01-17 MED ORDER — MIDAZOLAM HCL 2 MG/2ML IJ SOLN
INTRAMUSCULAR | Status: DC | PRN
  Administered 2018-01-17: 1 mg via INTRAVENOUS

## 2018-01-17 MED ORDER — BUPIVACAINE-EPINEPHRINE (PF) 0.5% -1:200000 IJ SOLN
INTRAMUSCULAR | Status: DC | PRN
  Administered 2018-01-17: 15 mL

## 2018-01-17 MED ORDER — CLINDAMYCIN PHOSPHATE 900 MG/50ML IV SOLN: 900.0000 mg | Freq: Once | INTRAVENOUS | Status: DC

## 2018-01-17 MED ORDER — KETAMINE HCL 50 MG/ML IJ SOLN
INTRAMUSCULAR | Status: DC | PRN
  Administered 2018-01-17 (×2): 25 mg via INTRAMUSCULAR

## 2018-01-17 MED ORDER — FENTANYL CITRATE (PF) 100 MCG/2ML IJ SOLN: 25.0000 ug | INTRAMUSCULAR | Status: DC | PRN

## 2018-01-17 MED ORDER — LIDOCAINE HCL (PF) 1 % IJ SOLN
INTRAMUSCULAR | Status: DC | PRN
  Administered 2018-01-17: 25 mL

## 2018-01-17 MED ORDER — SODIUM CHLORIDE 0.9 % IV SOLN: INTRAVENOUS | Status: DC

## 2018-01-17 MED ORDER — ONDANSETRON HCL 4 MG/2ML IJ SOLN: 4.0000 mg | Freq: Four times a day (QID) | INTRAMUSCULAR | Status: DC | PRN

## 2018-01-17 SURGICAL SUPPLY — 17 items
CEMENT BONE KYPHON CDS (Cement) ×3 IMPLANT
CEMENT KYPHON CX01A KIT/MIXER (Cement) ×3 IMPLANT
DERMABOND ADVANCED (GAUZE/BANDAGES/DRESSINGS) ×2
DERMABOND ADVANCED .7 DNX12 (GAUZE/BANDAGES/DRESSINGS) ×1 IMPLANT
DEVICE BIOPSY BONE KYPHX (INSTRUMENTS) ×3 IMPLANT
DRAPE C-ARM XRAY 36X54 (DRAPES) ×3 IMPLANT
DURAPREP 26ML APPLICATOR (WOUND CARE) ×3 IMPLANT
GLOVE SURG SYN 9.0  PF PI (GLOVE) ×2
GLOVE SURG SYN 9.0 PF PI (GLOVE) ×1 IMPLANT
GOWN SRG 2XL LVL 4 RGLN SLV (GOWNS) ×1 IMPLANT
GOWN STRL NON-REIN 2XL LVL4 (GOWNS) ×2
GOWN STRL REUS W/ TWL LRG LVL3 (GOWN DISPOSABLE) ×1 IMPLANT
GOWN STRL REUS W/TWL LRG LVL3 (GOWN DISPOSABLE) ×2
PACK KYPHOPLASTY (MISCELLANEOUS) ×3 IMPLANT
STRAP SAFETY 5IN WIDE (MISCELLANEOUS) IMPLANT
TRAY KYPHOPAK 15/3 EXPRESS 1ST (MISCELLANEOUS) IMPLANT
TRAY KYPHOPAK 20/3 EXPRESS 1ST (MISCELLANEOUS) ×3 IMPLANT

## 2018-01-17 NOTE — Anesthesia Post-op Follow-up Note (Signed)
Anesthesia QCDR form completed.        

## 2018-01-17 NOTE — Op Note (Signed)
01/17/2018  1:07 PM  PATIENT:  Jamie Parsons.  78 y.o. male  PRE-OPERATIVE DIAGNOSIS:  closed wedge compression fracture of second lumbar  POST-OPERATIVE DIAGNOSIS:  closed wedge compression fracture of second lumbar  PROCEDURE:  Procedure(s): KYPHOPLASTY-L2 (N/A)  SURGEON: Laurene Footman, MD  ASSISTANTS: None  ANESTHESIA:   local and MAC  EBL:  Total I/O In: -  Out: 5 [Blood:5]  BLOOD ADMINISTERED:none  DRAINS: none   LOCAL MEDICATIONS USED:  MARCAINE    and XYLOCAINE   SPECIMEN:  Source of Specimen:  L2 vertebral body  DISPOSITION OF SPECIMEN:  PATHOLOGY  COUNTS:  YES  TOURNIQUET:  * No tourniquets in log *  IMPLANTS: Bone cement  DICTATION: .Dragon Dictation   patient was brought to the operating room and after adequate sedation was obtained, patient was placed prone. C-arm was brought in and good visualization in both AP and lateral projections was obtained of L2. After patient identification and timeout procedures were completed, 10 cc 1% Xylocaine was infiltrated on right side of the spine at the level of the planned incisions. After prepping and draping and repeat timeout procedure, spinal needle was used to get down to the pedicle on right side and a 50-50 mix of 1% Xylocaine have percent Sensorcaine with epinephrine was injected. Small incision was made and a transpedicular approach carried out with biopsy obtained. Drilling was carried out and inflation of the proximally 4 cc after cement was mixed and the appropriate consistency approximate 6 cc was used to fill the vertebral body There is good interdigitation and no extravasation.  When the cement had set the trochars removed and permanent C-arm views obtained. The was closed with Dermabond followed by a Band-Aid    PLAN OF CARE: Discharge to home after PACU  PATIENT DISPOSITION:  PACU - hemodynamically stable.

## 2018-01-17 NOTE — Discharge Instructions (Addendum)
We Band-Aid on until Saturday and then remove okay to shower after that. Minimize activity today and tomorrow Saturday resume usual activities with no lifting over 5 pounds. Call my office if your having problems tomorrow  AMBULATORY SURGERY  DISCHARGE INSTRUCTIONS   1) The drugs that you were given will stay in your system until tomorrow so for the next 24 hours you should not:  A) Drive an automobile B) Make any legal decisions C) Drink any alcoholic beverage   2) You may resume regular meals tomorrow.  Today it is better to start with liquids and gradually work up to solid foods.  You may eat anything you prefer, but it is better to start with liquids, then soup and crackers, and gradually work up to solid foods.   3) Please notify your doctor immediately if you have any unusual bleeding, trouble breathing, redness and pain at the surgery site, drainage, fever, or pain not relieved by medication.    4) Additional Instructions:        Please contact your physician with any problems or Same Day Surgery at [phone], Monday through Friday 6 am to 4 pm, or [place] [facility] Main number at [phone].

## 2018-01-17 NOTE — OR Nursing (Signed)
Per Reece Levyeb, PACU RN, Dr. Rosita KeaMenz advises pt should resume plavix tonight, pt and family notified of same postop by Kenna GilbertS Edyn Qazi, RN

## 2018-01-17 NOTE — Transfer of Care (Signed)
Immediate Anesthesia Transfer of Care Note  Patient: Jamie CellaDavid James Debord Jr.  Procedure(s) Performed: KYPHOPLASTY-L2 (N/A )  Patient Location: PACU  Anesthesia Type:General  Level of Consciousness: awake and alert   Airway & Oxygen Therapy: Patient Spontanous Breathing and Patient connected to nasal cannula oxygen  Post-op Assessment: Report given to RN and Post -op Vital signs reviewed and stable  Post vital signs: Reviewed and stable  Last Vitals:  Vitals:   01/17/18 1108 01/17/18 1129  BP: 119/63 119/68  Pulse:  (!) 117  Resp: 18   Temp: 36.7 C   SpO2: 94%     Last Pain:  Vitals:   01/17/18 1108  TempSrc: Temporal  PainSc: 8          Complications: No apparent anesthesia complications

## 2018-01-17 NOTE — Anesthesia Procedure Notes (Signed)
Date/Time: 01/17/2018 12:27 PM Performed by: Junious SilkNoles, Keola Heninger, CRNA Pre-anesthesia Checklist: Patient identified, Emergency Drugs available, Suction available, Patient being monitored and Timeout performed Oxygen Delivery Method: Nasal cannula

## 2018-01-17 NOTE — Anesthesia Postprocedure Evaluation (Signed)
Anesthesia Post Note  Patient: Jamie Parsons.  Procedure(s) Performed: KYPHOPLASTY-L2 (N/A )  Patient location during evaluation: PACU Anesthesia Type: MAC Level of consciousness: awake and alert Pain management: pain level controlled Vital Signs Assessment: post-procedure vital signs reviewed and stable Respiratory status: spontaneous breathing, nonlabored ventilation, respiratory function stable and patient connected to nasal cannula oxygen Cardiovascular status: stable and blood pressure returned to baseline Postop Assessment: no apparent nausea or vomiting Anesthetic complications: no     Last Vitals:  Vitals:   01/17/18 1351 01/17/18 1409  BP: 125/76 124/70  Pulse: 76 74  Resp: 16 16  Temp: 36.6 C   SpO2: 95% 99%    Last Pain:  Vitals:   01/17/18 1409  TempSrc:   PainSc: [place]

## 2018-01-17 NOTE — H&P (Signed)
Reviewed paper H+P, will be scanned into chart. No changes noted.  

## 2018-01-17 NOTE — Anesthesia Preprocedure Evaluation (Addendum)
Anesthesia Evaluation  Patient identified by MRN, date of birth, ID band Patient awake    Reviewed: Allergy & Precautions, NPO status , Patient's Chart, lab work & pertinent test results, reviewed documented beta blocker date and time   Airway Mallampati: II  TM Distance: >3 FB     Dental  (+) Chipped   Pulmonary COPD,           Cardiovascular      Neuro/Psych Seizures -,  PSYCHIATRIC DISORDERS Depression CVA    GI/Hepatic   Endo/Other    Renal/GU      Musculoskeletal   Abdominal   Peds  Hematology   Anesthesia Other Findings ETOH hx. On dilantin.  Reproductive/Obstetrics                             Anesthesia Physical Anesthesia Plan  ASA: III  Anesthesia Plan: MAC   Post-op Pain Management:    Induction:   PONV Risk Score and Plan:   Airway Management Planned:   Additional Equipment:   Intra-op Plan:   Post-operative Plan:   Informed Consent: I have reviewed the patients History and Physical, chart, labs and discussed the procedure including the risks, benefits and alternatives for the proposed anesthesia with the patient or authorized representative who has indicated his/her understanding and acceptance.     Plan Discussed with: CRNA  Anesthesia Plan Comments:         Anesthesia Quick Evaluation

## 2018-01-18 LAB — SURGICAL PATHOLOGY

## 2018-02-05 DIAGNOSIS — S32020A Wedge compression fracture of second lumbar vertebra, initial encounter for closed fracture: Secondary | ICD-10-CM | POA: Diagnosis not present

## 2018-05-08 ENCOUNTER — Emergency Department: Payer: PPO

## 2018-05-08 ENCOUNTER — Observation Stay
Admission: EM | Admit: 2018-05-08 | Discharge: 2018-05-09 | Disposition: A | Discharge status: Home / Self Care | Payer: PPO | Attending: Internal Medicine | Admitting: Internal Medicine

## 2018-05-08 ENCOUNTER — Other Ambulatory Visit: Payer: Self-pay

## 2018-05-08 DIAGNOSIS — Z66 Do not resuscitate: Secondary | ICD-10-CM | POA: Diagnosis not present

## 2018-05-08 DIAGNOSIS — G40909 Epilepsy, unspecified, not intractable, without status epilepticus: Secondary | ICD-10-CM | POA: Insufficient documentation

## 2018-05-08 DIAGNOSIS — K219 Gastro-esophageal reflux disease without esophagitis: Secondary | ICD-10-CM | POA: Insufficient documentation

## 2018-05-08 DIAGNOSIS — Z880 Allergy status to penicillin: Secondary | ICD-10-CM | POA: Insufficient documentation

## 2018-05-08 DIAGNOSIS — J439 Emphysema, unspecified: Secondary | ICD-10-CM | POA: Insufficient documentation

## 2018-05-08 DIAGNOSIS — F17210 Nicotine dependence, cigarettes, uncomplicated: Secondary | ICD-10-CM | POA: Diagnosis not present

## 2018-05-08 DIAGNOSIS — I10 Essential (primary) hypertension: Secondary | ICD-10-CM | POA: Insufficient documentation

## 2018-05-08 DIAGNOSIS — R0789 Other chest pain: Secondary | ICD-10-CM | POA: Diagnosis not present

## 2018-05-08 DIAGNOSIS — R079 Chest pain, unspecified: Principal | ICD-10-CM | POA: Diagnosis present

## 2018-05-08 DIAGNOSIS — E785 Hyperlipidemia, unspecified: Secondary | ICD-10-CM | POA: Diagnosis not present

## 2018-05-08 DIAGNOSIS — I700 Atherosclerosis of aorta: Secondary | ICD-10-CM | POA: Insufficient documentation

## 2018-05-08 DIAGNOSIS — F4321 Adjustment disorder with depressed mood: Secondary | ICD-10-CM | POA: Diagnosis not present

## 2018-05-08 DIAGNOSIS — Z7902 Long term (current) use of antithrombotics/antiplatelets: Secondary | ICD-10-CM | POA: Diagnosis not present

## 2018-05-08 DIAGNOSIS — Z79899 Other long term (current) drug therapy: Secondary | ICD-10-CM | POA: Diagnosis not present

## 2018-05-08 DIAGNOSIS — Z8673 Personal history of transient ischemic attack (TIA), and cerebral infarction without residual deficits: Secondary | ICD-10-CM | POA: Diagnosis not present

## 2018-05-08 DIAGNOSIS — K8020 Calculus of gallbladder without cholecystitis without obstruction: Secondary | ICD-10-CM | POA: Diagnosis not present

## 2018-05-08 DIAGNOSIS — F0390 Unspecified dementia without behavioral disturbance: Secondary | ICD-10-CM | POA: Diagnosis not present

## 2018-05-08 DIAGNOSIS — Z8249 Family history of ischemic heart disease and other diseases of the circulatory system: Secondary | ICD-10-CM | POA: Insufficient documentation

## 2018-05-08 LAB — TROPONIN I
Troponin I: <0.03 ng/mL (ref <0.03)
Troponin I: <0.03 ng/mL (ref <0.03)
Troponin I: <0.03 ng/mL (ref <0.03)

## 2018-05-08 LAB — COMPREHENSIVE METABOLIC PANEL
ALBUMIN: 3.8 g/dL (ref 3.5–5.0)
ALK PHOS: 59 U/L (ref 38–126)
ALT: 14 U/L — AB (ref 17–63)
AST: 22 U/L (ref 15–41)
Anion gap: 10 (ref 5–15)
BUN: 17 mg/dL (ref 6–20)
CALCIUM: 8.9 mg/dL (ref 8.9–10.3)
CO2: 29 mmol/L (ref 22–32)
Chloride: 101 mmol/L (ref 101–111)
Creatinine, Ser: 1.13 mg/dL (ref 0.61–1.24)
GFR calc Af Amer: >60 mL/min (ref >60)
GFR calc non Af Amer: 60 mL/min — ABNORMAL LOW (ref >60)
GLUCOSE: 100 mg/dL — AB (ref 65–99)
Potassium: 3.8 mmol/L (ref 3.5–5.1)
Sodium: 140 mmol/L (ref 135–145)
TOTAL PROTEIN: 6.9 g/dL (ref 6.5–8.1)
Total Bilirubin: 0.3 mg/dL (ref 0.3–1.2)

## 2018-05-08 LAB — CBC WITH DIFFERENTIAL/PLATELET
BASOS ABS: 0 10*3/uL (ref 0–0.1)
Basophils Relative: 1 %
Eosinophils Absolute: 0.2 10*3/uL (ref 0–0.7)
Eosinophils Relative: 4 %
HEMATOCRIT: 37.7 % — AB (ref 40.0–52.0)
HEMOGLOBIN: 12.6 g/dL — AB (ref 13.0–18.0)
LYMPHS PCT: 20 %
Lymphs Abs: 1.4 10*3/uL (ref 1.0–3.6)
MCH: 30 pg (ref 26.0–34.0)
MCHC: 33.6 g/dL (ref 32.0–36.0)
MCV: 89.5 fL (ref 80.0–100.0)
MONO ABS: 1.2 10*3/uL — AB (ref 0.2–1.0)
Monocytes Relative: 17 %
NEUTROS ABS: 4 10*3/uL (ref 1.4–6.5)
Neutrophils Relative %: 58 %
Platelets: 214 10*3/uL (ref 150–440)
RBC: 4.21 MIL/uL — AB (ref 4.40–5.90)
RDW: 14.8 % — ABNORMAL HIGH (ref 11.5–14.5)
WBC: 6.8 10*3/uL (ref 3.8–10.6)

## 2018-05-08 LAB — CBC
HCT: 40.5 % (ref 40.0–52.0)
Hemoglobin: 13.3 g/dL (ref 13.0–18.0)
MCH: 29.5 pg (ref 26.0–34.0)
MCHC: 32.8 g/dL (ref 32.0–36.0)
MCV: 90.2 fL (ref 80.0–100.0)
PLATELETS: 198 10*3/uL (ref 150–440)
RBC: 4.49 MIL/uL (ref 4.40–5.90)
RDW: 14.9 % — AB (ref 11.5–14.5)
WBC: 5.1 10*3/uL (ref 3.8–10.6)

## 2018-05-08 LAB — LIPID PANEL
CHOLESTEROL: 175 mg/dL (ref 0–200)
HDL: 76 mg/dL (ref >40)
LDL Cholesterol: 88 mg/dL (ref 0–99)
Total CHOL/HDL Ratio: 2.3 RATIO
Triglycerides: 54 mg/dL (ref <150)
VLDL: 11 mg/dL (ref 0–40)

## 2018-05-08 LAB — CREATININE, SERUM
Creatinine, Ser: 0.97 mg/dL (ref 0.61–1.24)
GFR calc Af Amer: >60 mL/min (ref >60)
GFR calc non Af Amer: >60 mL/min (ref >60)

## 2018-05-08 LAB — HEMOGLOBIN A1C
Hgb A1c MFr Bld: 5.7 % — ABNORMAL HIGH (ref 4.8–5.6)
MEAN PLASMA GLUCOSE: 116.89 mg/dL

## 2018-05-08 LAB — LIPASE, BLOOD: Lipase: 37 U/L (ref 11–51)

## 2018-05-08 MED ORDER — LISINOPRIL 10 MG PO TABS
10.0000 mg | ORAL_TABLET | Freq: Every day | ORAL | Status: DC
  Administered 2018-05-08: 10 mg via ORAL
  Filled 2018-05-08: qty 1

## 2018-05-08 MED ORDER — SENNOSIDES-DOCUSATE SODIUM 8.6-50 MG PO TABS: 1.0000 | ORAL_TABLET | Freq: Every evening | ORAL | Status: DC | PRN

## 2018-05-08 MED ORDER — MORPHINE SULFATE (PF) 4 MG/ML IV SOLN
4.0000 mg | Freq: Once | INTRAVENOUS | Status: AC
  Administered 2018-05-08: 4 mg via INTRAVENOUS
  Filled 2018-05-08: qty 1

## 2018-05-08 MED ORDER — IBUPROFEN 400 MG PO TABS: 400.0000 mg | ORAL_TABLET | Freq: Four times a day (QID) | ORAL | Status: DC | PRN

## 2018-05-08 MED ORDER — AMITRIPTYLINE HCL 25 MG PO TABS: 100.0000 mg | ORAL_TABLET | Freq: Every day | ORAL | Status: DC

## 2018-05-08 MED ORDER — ONDANSETRON HCL 4 MG/2ML IJ SOLN
4.0000 mg | INTRAMUSCULAR | Status: AC
  Administered 2018-05-08: 4 mg via INTRAVENOUS
  Filled 2018-05-08: qty 2

## 2018-05-08 MED ORDER — CLOPIDOGREL BISULFATE 75 MG PO TABS
75.0000 mg | ORAL_TABLET | Freq: Every day | ORAL | Status: DC
  Administered 2018-05-08 – 2018-05-09 (×2): 75 mg via ORAL
  Filled 2018-05-08 (×2): qty 1

## 2018-05-08 MED ORDER — ACETAMINOPHEN 325 MG PO TABS
650.0000 mg | ORAL_TABLET | Freq: Four times a day (QID) | ORAL | Status: DC | PRN
  Administered 2018-05-08: 650 mg via ORAL

## 2018-05-08 MED ORDER — SODIUM CHLORIDE 0.9% FLUSH
3.0000 mL | Freq: Two times a day (BID) | INTRAVENOUS | Status: DC
  Administered 2018-05-08: 3 mL via INTRAVENOUS

## 2018-05-08 MED ORDER — ONDANSETRON HCL 4 MG PO TABS: 4.0000 mg | ORAL_TABLET | Freq: Four times a day (QID) | ORAL | Status: DC | PRN

## 2018-05-08 MED ORDER — IOPAMIDOL (ISOVUE-370) INJECTION 76%
125.0000 mL | Freq: Once | INTRAVENOUS | Status: AC | PRN
  Administered 2018-05-08: 125 mL via INTRAVENOUS

## 2018-05-08 MED ORDER — AMITRIPTYLINE HCL 25 MG PO TABS: 50.0000 mg | ORAL_TABLET | Freq: Every day | ORAL | Status: DC

## 2018-05-08 MED ORDER — DONEPEZIL HCL 5 MG PO TABS
5.0000 mg | ORAL_TABLET | Freq: Every day | ORAL | Status: DC
  Administered 2018-05-08: 5 mg via ORAL
  Filled 2018-05-08 (×2): qty 1

## 2018-05-08 MED ORDER — PHENYTOIN SODIUM EXTENDED 100 MG PO CAPS
100.0000 mg | ORAL_CAPSULE | Freq: Three times a day (TID) | ORAL | Status: DC
  Administered 2018-05-08 – 2018-05-09 (×4): 100 mg via ORAL
  Filled 2018-05-08 (×6): qty 1

## 2018-05-08 MED ORDER — ASPIRIN EC 81 MG PO TBEC
81.0000 mg | DELAYED_RELEASE_TABLET | Freq: Every day | ORAL | Status: DC
  Administered 2018-05-08 – 2018-05-09 (×2): 81 mg via ORAL
  Filled 2018-05-08 (×2): qty 1

## 2018-05-08 MED ORDER — ONDANSETRON HCL 4 MG/2ML IJ SOLN: 4.0000 mg | Freq: Four times a day (QID) | INTRAMUSCULAR | Status: DC | PRN

## 2018-05-08 MED ORDER — ACETAMINOPHEN 325 MG PO TABS
325.0000 mg | ORAL_TABLET | Freq: Four times a day (QID) | ORAL | Status: DC | PRN
Start: 2018-05-08 — End: 2018-05-09
  Filled 2018-05-08: qty 2

## 2018-05-08 MED ORDER — ENOXAPARIN SODIUM 40 MG/0.4ML ~~LOC~~ SOLN
40.0000 mg | SUBCUTANEOUS | Status: DC
  Administered 2018-05-08: 40 mg via SUBCUTANEOUS
  Filled 2018-05-08: qty 0.4

## 2018-05-08 MED ORDER — AMITRIPTYLINE HCL 25 MG PO TABS
150.0000 mg | ORAL_TABLET | Freq: Two times a day (BID) | ORAL | Status: DC
  Administered 2018-05-08: 150 mg via ORAL
  Filled 2018-05-08: qty 3

## 2018-05-08 MED ORDER — OXYCODONE HCL 5 MG PO TABS
5.0000 mg | ORAL_TABLET | Freq: Four times a day (QID) | ORAL | Status: DC | PRN
  Administered 2018-05-09: 5 mg via ORAL
  Filled 2018-05-08: qty 1

## 2018-05-08 MED ORDER — SODIUM CHLORIDE 0.9 % IV BOLUS
500.0000 mL | INTRAVENOUS | Status: AC
  Administered 2018-05-08: 500 mL via INTRAVENOUS

## 2018-05-08 MED ORDER — ATENOLOL 25 MG PO TABS
25.0000 mg | ORAL_TABLET | Freq: Every day | ORAL | Status: DC
  Administered 2018-05-08: 25 mg via ORAL
  Filled 2018-05-08: qty 1

## 2018-05-08 MED ORDER — ACETAMINOPHEN 650 MG RE SUPP: 650.0000 mg | Freq: Four times a day (QID) | RECTAL | Status: DC | PRN

## 2018-05-08 NOTE — Care Management Obs Status (Signed)
MEDICARE OBSERVATION STATUS NOTIFICATION   Patient Details  Name: Jamie CellaDavid James Swim Jr. MRN: [id number] Date of Birth: 1940-12-22   Medicare Observation Status Notification Given:  Yes    Collie Siadngela Sebasthian Stailey, RN 05/08/2018, 10:58 AM

## 2018-05-08 NOTE — Progress Notes (Signed)
Chaplain responded to an OR for an AD. The pt did not want it. He said his wife know that he wants to be DNR. Chaplain let him know we are available when needed. Pt was disoriented and would not have passed screening questions.    05/08/18 1400  Clinical Encounter Type  Visited With Patient  Visit Type Initial  Referral From Physician  Spiritual Encounters  Spiritual Needs Brochure

## 2018-05-08 NOTE — Plan of Care (Signed)
  Problem: Education: Goal: Knowledge of General Education information will improve Outcome: Progressing   Problem: Clinical Measurements: Goal: Will remain free from infection Outcome: Progressing Goal: Diagnostic test results will improve Outcome: Progressing Goal: Respiratory complications will improve Outcome: Progressing   Problem: Activity: Goal: Risk for activity intolerance will decrease Outcome: Progressing   Problem: Nutrition: Goal: Adequate nutrition will be maintained Outcome: Progressing   Problem: Coping: Goal: Level of anxiety will decrease Outcome: Progressing   Problem: Pain Managment: Goal: General experience of comfort will improve Outcome: Progressing   Problem: Safety: Goal: Ability to remain free from injury will improve Outcome: Progressing   Problem: Skin Integrity: Goal: Risk for impaired skin integrity will decrease Outcome: Progressing   Problem: Activity: Goal: Ability to tolerate increased activity will improve Outcome: Progressing   Problem: Clinical Measurements: Goal: Ability to maintain a body temperature in the normal range will improve Outcome: Progressing   Problem: Respiratory: Goal: Ability to maintain adequate ventilation will improve Outcome: Progressing Goal: Ability to maintain a clear airway will improve Outcome: Progressing   Problem: Education: Goal: Understanding of cardiac disease, CV risk reduction, and recovery process will improve Outcome: Progressing   Problem: Activity: Goal: Ability to tolerate increased activity will improve Outcome: Progressing   Problem: Cardiac: Goal: Ability to achieve and maintain adequate cardiovascular perfusion will improve Outcome: Progressing   Problem: Health Behavior/Discharge Planning: Goal: Ability to safely manage health-related needs after discharge will improve Outcome: Progressing

## 2018-05-08 NOTE — Care Management Note (Signed)
Case Management Note  Patient Details  Name: Jamie Parsons. MRN: [id number] Date of Birth: 18-May-1941  Subjective/Objective:                  RNCM met with patient to delivery and explain MOON letter. He is from home with his wife. He states he is able to drive. He has a cane at bedside in ED however he states he also uses a walker for mobility. 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  He denies problems obtaining medications or getting to [facility] in [place] for PCP. O2 is acute.   Action/Plan:   RNCM to follow.  Expected Discharge Date:                  Expected Discharge Plan:     In-House Referral:     Discharge planning Services     Post Acute Care Choice:    Choice offered to:     DME Arranged:    DME Agency:     HH Arranged:    HH Agency:     Status of Service:     If discussed at [employer], dates discussed:    Additional Comments:  Marshell Garfinkel, RN 05/08/2018, 11:27 AM

## 2018-05-08 NOTE — ED Triage Notes (Signed)
Pt came from home via EMS with complaints of intermittent chest pain which radiates towards his back. Pt is alert and oriented x 4. EMS gave 324 ASA and 1 Nitro which pt reported made his pain worse. EMS placed a 20 gauge in left AC. VS per EMS BP-138/79 O2sat-96%RA.

## 2018-05-08 NOTE — ED Notes (Signed)
Pt asked if he wanted more food. Pt states he doesn't want any other food right now
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
## 2018-05-08 NOTE — ED Notes (Signed)
Placed 2L nasal cannula for comfort .

## 2018-05-08 NOTE — Progress Notes (Signed)
Family Meeting Note  Advance Directive:no  Today a meeting took place with the Patient. The following clinical team members were present during this meeting:MD  The following were discussed:Patient's diagnosis: , Patient's progosis: Unable to determine and Goals for treatment: DNR  Additional follow-up to be provided: dnr order written  Needs chaplain for advanced directives he tells me that his wife has one but he does not.  Time spent during discussion:18 minutes  Lasasha Brophy, MD
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
## 2018-05-09 ENCOUNTER — Observation Stay (HOSPITAL_BASED_OUTPATIENT_CLINIC_OR_DEPARTMENT_OTHER): Payer: PPO

## 2018-05-09 DIAGNOSIS — F0390 Unspecified dementia without behavioral disturbance: Secondary | ICD-10-CM | POA: Diagnosis not present

## 2018-05-09 DIAGNOSIS — G40909 Epilepsy, unspecified, not intractable, without status epilepticus: Secondary | ICD-10-CM | POA: Diagnosis not present

## 2018-05-09 DIAGNOSIS — R079 Chest pain, unspecified: Secondary | ICD-10-CM | POA: Diagnosis not present

## 2018-05-09 LAB — NM MYOCAR MULTI W/SPECT W/WALL MOTION / EF
CHL CUP NUCLEAR SDS: 0
CHL CUP NUCLEAR SRS: 0
CSEPPHR: 97 {beats}/min
LV dias vol: 40 mL (ref 62–150)
LVSYSVOL: 14 mL
Percent HR: 68 %
Rest HR: 78 {beats}/min
SSS: 0
TID: 1.23

## 2018-05-09 LAB — TROPONIN I: Troponin I: <0.03 ng/mL

## 2018-05-09 LAB — BASIC METABOLIC PANEL WITH GFR
Anion gap: 8 (ref 5–15)
BUN: 18 mg/dL (ref 6–20)
CO2: 29 mmol/L (ref 22–32)
Calcium: 8.8 mg/dL — ABNORMAL LOW (ref 8.9–10.3)
Chloride: 100 mmol/L — ABNORMAL LOW (ref 101–111)
Creatinine, Ser: 1.01 mg/dL (ref 0.61–1.24)
GFR calc Af Amer: >60 mL/min
GFR calc non Af Amer: >60 mL/min
Glucose, Bld: 84 mg/dL (ref 65–99)
Potassium: 4.1 mmol/L (ref 3.5–5.1)
Sodium: 137 mmol/L (ref 135–145)

## 2018-05-09 MED ORDER — REGADENOSON 0.4 MG/5ML IV SOLN
0.4000 mg | Freq: Once | INTRAVENOUS | Status: AC
  Administered 2018-05-09: 0.4 mg via INTRAVENOUS

## 2018-05-09 MED ORDER — TECHNETIUM TC 99M TETROFOSMIN IV KIT
32.9700 | PACK | Freq: Once | INTRAVENOUS | Status: AC | PRN
  Administered 2018-05-09: 32.97 via INTRAVENOUS

## 2018-05-09 MED ORDER — TECHNETIUM TC 99M TETROFOSMIN IV KIT
13.6100 | PACK | Freq: Once | INTRAVENOUS | Status: AC | PRN
Start: 2018-05-09 — End: 2018-05-09
  Administered 2018-05-09: 13.61 via INTRAVENOUS

## 2018-05-09 MED ORDER — MORPHINE SULFATE (PF) 2 MG/ML IV SOLN
1.0000 mg | INTRAVENOUS | Status: AC
  Administered 2018-05-09: 1 mg via INTRAVENOUS
  Filled 2018-05-09: qty 1

## 2018-05-09 NOTE — Progress Notes (Signed)
MD Juliene PinaMody was paged due to pt unable to lay flat for nuclear medicine scan. Morphine 1mg  iv STAT ordered.

## 2018-05-09 NOTE — Progress Notes (Signed)
Nutrition Brief Note  Patient identified on the Malnutrition Screening Tool (MST) Report  77 y/o male with h/o COPD, CVA, seizure disorder, mood disorder, depression, etoh abuse (pt reports quit drinking ~20yrs ago) admitted with chest pain  Met with pt's family in room today. Family reports pt with good appetite and oral intake at baseline. Family reports pt's wt loss occurred many years ago and that he used to weigh ~175lbs. Per chart review, pt has been weight stable for the past 3 years.   Wt Readings from Last 15 Encounters:  05/08/18 140 lb (63.5 kg)  01/17/18 145 lb (65.8 kg)  01/02/18 145 lb (65.8 kg)  01/02/18 150 lb 2.1 oz (68.1 kg)  07/10/17 140 lb (63.5 kg)    Body mass index is 17.97 kg/m. Patient meets criteria for underweight based on current BMI.   Current diet order is HH. Patient NPO today for cardiac stress test. Spoke to pt's family at bedside who reports pt with good appetite and oral intake at baseline.   Labs and medications reviewed.   Pt to discharge today. No nutrition interventions warranted at this time. If nutrition issues arise, please consult RD.     MS, RD, LDN Pager #- [phone] Office#- [phone] After Hours Pager: [phone]   
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
## 2018-05-09 NOTE — Plan of Care (Signed)
No chest pain this shift. SR 60-70s this shift.

## 2018-05-09 NOTE — Progress Notes (Signed)
Discharge instructions explained to pt and pt spouse/ verbalized an understanding/ iv and tele removed/ will transport off unit via wheelchair.

## 2018-05-23 DIAGNOSIS — R0789 Other chest pain: Secondary | ICD-10-CM | POA: Diagnosis not present

## 2018-06-17 DIAGNOSIS — J418 Mixed simple and mucopurulent chronic bronchitis: Secondary | ICD-10-CM | POA: Diagnosis not present

## 2018-06-17 DIAGNOSIS — I10 Essential (primary) hypertension: Secondary | ICD-10-CM | POA: Diagnosis not present

## 2018-06-17 DIAGNOSIS — I7300 Raynaud's syndrome without gangrene: Secondary | ICD-10-CM | POA: Diagnosis not present

## 2018-06-17 DIAGNOSIS — F3341 Major depressive disorder, recurrent, in partial remission: Secondary | ICD-10-CM | POA: Diagnosis not present

## 2018-06-17 DIAGNOSIS — G40909 Epilepsy, unspecified, not intractable, without status epilepticus: Secondary | ICD-10-CM | POA: Diagnosis not present

## 2018-06-17 DIAGNOSIS — E7800 Pure hypercholesterolemia, unspecified: Secondary | ICD-10-CM | POA: Diagnosis not present

## 2018-06-17 DIAGNOSIS — R7302 Impaired glucose tolerance (oral): Secondary | ICD-10-CM | POA: Diagnosis not present

## 2018-06-20 DIAGNOSIS — E7800 Pure hypercholesterolemia, unspecified: Secondary | ICD-10-CM | POA: Diagnosis not present

## 2018-06-20 DIAGNOSIS — R7302 Impaired glucose tolerance (oral): Secondary | ICD-10-CM | POA: Diagnosis not present

## 2018-07-05 ENCOUNTER — Emergency Department: Payer: PPO

## 2018-07-05 ENCOUNTER — Inpatient Hospital Stay
Admission: EM | Admit: 2018-07-05 | Discharge: 2018-07-07 | DRG: 192 | Disposition: A | Discharge status: Home Health | Payer: PPO | Attending: Specialist | Admitting: Specialist

## 2018-07-05 ENCOUNTER — Other Ambulatory Visit: Payer: Self-pay

## 2018-07-05 DIAGNOSIS — F17210 Nicotine dependence, cigarettes, uncomplicated: Secondary | ICD-10-CM | POA: Diagnosis not present

## 2018-07-05 DIAGNOSIS — Z23 Encounter for immunization: Secondary | ICD-10-CM | POA: Diagnosis not present

## 2018-07-05 DIAGNOSIS — G40909 Epilepsy, unspecified, not intractable, without status epilepticus: Secondary | ICD-10-CM | POA: Diagnosis not present

## 2018-07-05 DIAGNOSIS — I10 Essential (primary) hypertension: Secondary | ICD-10-CM | POA: Diagnosis present

## 2018-07-05 DIAGNOSIS — Z880 Allergy status to penicillin: Secondary | ICD-10-CM

## 2018-07-05 DIAGNOSIS — Z66 Do not resuscitate: Secondary | ICD-10-CM | POA: Diagnosis not present

## 2018-07-05 DIAGNOSIS — K219 Gastro-esophageal reflux disease without esophagitis: Secondary | ICD-10-CM | POA: Diagnosis present

## 2018-07-05 DIAGNOSIS — S0990XA Unspecified injury of head, initial encounter: Secondary | ICD-10-CM | POA: Diagnosis not present

## 2018-07-05 DIAGNOSIS — F0390 Unspecified dementia without behavioral disturbance: Secondary | ICD-10-CM | POA: Diagnosis not present

## 2018-07-05 DIAGNOSIS — J441 Chronic obstructive pulmonary disease with (acute) exacerbation: Secondary | ICD-10-CM | POA: Diagnosis not present

## 2018-07-05 DIAGNOSIS — E785 Hyperlipidemia, unspecified: Secondary | ICD-10-CM | POA: Diagnosis present

## 2018-07-05 DIAGNOSIS — Z8673 Personal history of transient ischemic attack (TIA), and cerebral infarction without residual deficits: Secondary | ICD-10-CM | POA: Diagnosis not present

## 2018-07-05 DIAGNOSIS — R42 Dizziness and giddiness: Secondary | ICD-10-CM | POA: Diagnosis not present

## 2018-07-05 DIAGNOSIS — Z79899 Other long term (current) drug therapy: Secondary | ICD-10-CM | POA: Diagnosis not present

## 2018-07-05 DIAGNOSIS — R0789 Other chest pain: Secondary | ICD-10-CM | POA: Diagnosis not present

## 2018-07-05 DIAGNOSIS — J9601 Acute respiratory failure with hypoxia: Secondary | ICD-10-CM | POA: Diagnosis not present

## 2018-07-05 DIAGNOSIS — F329 Major depressive disorder, single episode, unspecified: Secondary | ICD-10-CM | POA: Diagnosis present

## 2018-07-05 DIAGNOSIS — R531 Weakness: Secondary | ICD-10-CM | POA: Diagnosis not present

## 2018-07-05 DIAGNOSIS — J9600 Acute respiratory failure, unspecified whether with hypoxia or hypercapnia: Secondary | ICD-10-CM | POA: Diagnosis present

## 2018-07-05 DIAGNOSIS — S4991XA Unspecified injury of right shoulder and upper arm, initial encounter: Secondary | ICD-10-CM | POA: Diagnosis not present

## 2018-07-05 DIAGNOSIS — R296 Repeated falls: Secondary | ICD-10-CM | POA: Diagnosis present

## 2018-07-05 DIAGNOSIS — Z720 Tobacco use: Secondary | ICD-10-CM | POA: Diagnosis not present

## 2018-07-05 DIAGNOSIS — Z8249 Family history of ischemic heart disease and other diseases of the circulatory system: Secondary | ICD-10-CM | POA: Diagnosis not present

## 2018-07-05 DIAGNOSIS — S299XXA Unspecified injury of thorax, initial encounter: Secondary | ICD-10-CM | POA: Diagnosis not present

## 2018-07-05 DIAGNOSIS — M25511 Pain in right shoulder: Secondary | ICD-10-CM | POA: Diagnosis not present

## 2018-07-05 LAB — COMPREHENSIVE METABOLIC PANEL
ALT: 31 U/L (ref 0–44)
AST: 39 U/L (ref 15–41)
Albumin: 3.7 g/dL (ref 3.5–5.0)
Alkaline Phosphatase: 83 U/L (ref 38–126)
Anion gap: 9 (ref 5–15)
BILIRUBIN TOTAL: 0.8 mg/dL (ref 0.3–1.2)
BUN: 17 mg/dL (ref 8–23)
CO2: 31 mmol/L (ref 22–32)
Calcium: 9 mg/dL (ref 8.9–10.3)
Chloride: 97 mmol/L — ABNORMAL LOW (ref 98–111)
Creatinine, Ser: 1.14 mg/dL (ref 0.61–1.24)
GFR, EST NON AFRICAN AMERICAN: 59 mL/min — AB (ref >60)
Glucose, Bld: 175 mg/dL — ABNORMAL HIGH (ref 70–99)
POTASSIUM: 3.8 mmol/L (ref 3.5–5.1)
Sodium: 137 mmol/L (ref 135–145)
TOTAL PROTEIN: 7.5 g/dL (ref 6.5–8.1)

## 2018-07-05 LAB — URINALYSIS, COMPLETE (UACMP) WITH MICROSCOPIC
Bacteria, UA: NONE SEEN
Bilirubin Urine: NEGATIVE
GLUCOSE, UA: NEGATIVE mg/dL
HGB URINE DIPSTICK: NEGATIVE
Ketones, ur: NEGATIVE mg/dL
LEUKOCYTES UA: NEGATIVE
NITRITE: NEGATIVE
PH: 5 (ref 5.0–8.0)
Protein, ur: 30 mg/dL — AB
SPECIFIC GRAVITY, URINE: 1.021 (ref 1.005–1.030)

## 2018-07-05 LAB — CBC WITH DIFFERENTIAL/PLATELET
BASOS ABS: 0 10*3/uL (ref 0–0.1)
Basophils Relative: 0 %
Eosinophils Absolute: 0 10*3/uL (ref 0–0.7)
Eosinophils Relative: 0 %
HCT: 37.4 % — ABNORMAL LOW (ref 40.0–52.0)
Hemoglobin: 12.8 g/dL — ABNORMAL LOW (ref 13.0–18.0)
Lymphocytes Relative: 7 %
Lymphs Abs: 0.9 10*3/uL — ABNORMAL LOW (ref 1.0–3.6)
MCH: 31.1 pg (ref 26.0–34.0)
MCHC: 34.3 g/dL (ref 32.0–36.0)
MCV: 90.9 fL (ref 80.0–100.0)
MONO ABS: 2.2 10*3/uL — AB (ref 0.2–1.0)
MONOS PCT: 18 %
NEUTROS PCT: 75 %
Neutro Abs: 9.2 10*3/uL — ABNORMAL HIGH (ref 1.4–6.5)
PLATELETS: 198 10*3/uL (ref 150–440)
RBC: 4.11 MIL/uL — ABNORMAL LOW (ref 4.40–5.90)
RDW: 14.4 % (ref 11.5–14.5)
WBC: 12.3 10*3/uL — AB (ref 3.8–10.6)

## 2018-07-05 LAB — LACTIC ACID, PLASMA: Lactic Acid, Venous: 1.3 mmol/L (ref 0.5–1.9)

## 2018-07-05 MED ORDER — DOXYCYCLINE HYCLATE 100 MG PO TABS
100.0000 mg | ORAL_TABLET | Freq: Once | ORAL | Status: AC
  Administered 2018-07-05: 100 mg via ORAL
  Filled 2018-07-05: qty 1

## 2018-07-05 MED ORDER — METHYLPREDNISOLONE SODIUM SUCC 125 MG IJ SOLR
125.0000 mg | Freq: Once | INTRAMUSCULAR | Status: AC
  Administered 2018-07-05: 125 mg via INTRAVENOUS

## 2018-07-05 MED ORDER — IPRATROPIUM-ALBUTEROL 0.5-2.5 (3) MG/3ML IN SOLN
3.0000 mL | Freq: Once | RESPIRATORY_TRACT | Status: AC
  Administered 2018-07-05: 3 mL via RESPIRATORY_TRACT
  Filled 2018-07-05: qty 3

## 2018-07-05 MED ORDER — METHYLPREDNISOLONE SODIUM SUCC 125 MG IJ SOLR
INTRAMUSCULAR | Status: AC
  Filled 2018-07-05: qty 2

## 2018-07-05 NOTE — ED Notes (Signed)
Pt unable to void at this time. 

## 2018-07-05 NOTE — ED Notes (Signed)
Pt brought in via ems with frequent falls, wheezing.  On arrival to treatment room, pt wheezing.  Pt alert.  Right shoulder red from recent fall.  Pt denies chest pian.  cig smoker.  md at bedside.  Breathing treatment given.

## 2018-07-05 NOTE — ED Notes (Signed)
md at bedside

## 2018-07-05 NOTE — ED Notes (Signed)
Patient transported to CT 

## 2018-07-05 NOTE — ED Triage Notes (Signed)
Pt with ems with frequent falls, fever, wheezing, shoulder pain, back pain, tachycardia and dizziness. Ems gave pt albuterol, ns bolus. fsbs 175. Per ems pt was outside when his spouse heard him fall. Ems states they attempted to stand pt and he became dizzy. Pt complains of right shoulder and back pain, ems states pt with temp 100.
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
## 2018-07-06 ENCOUNTER — Encounter: Payer: Self-pay | Admitting: *Deleted

## 2018-07-06 ENCOUNTER — Other Ambulatory Visit: Payer: Self-pay

## 2018-07-06 DIAGNOSIS — Z8249 Family history of ischemic heart disease and other diseases of the circulatory system: Secondary | ICD-10-CM | POA: Diagnosis not present

## 2018-07-06 DIAGNOSIS — E785 Hyperlipidemia, unspecified: Secondary | ICD-10-CM | POA: Diagnosis present

## 2018-07-06 DIAGNOSIS — Z880 Allergy status to penicillin: Secondary | ICD-10-CM | POA: Diagnosis not present

## 2018-07-06 DIAGNOSIS — Z23 Encounter for immunization: Secondary | ICD-10-CM | POA: Diagnosis present

## 2018-07-06 DIAGNOSIS — I10 Essential (primary) hypertension: Secondary | ICD-10-CM | POA: Diagnosis present

## 2018-07-06 DIAGNOSIS — F329 Major depressive disorder, single episode, unspecified: Secondary | ICD-10-CM | POA: Diagnosis present

## 2018-07-06 DIAGNOSIS — F17210 Nicotine dependence, cigarettes, uncomplicated: Secondary | ICD-10-CM | POA: Diagnosis present

## 2018-07-06 DIAGNOSIS — Z66 Do not resuscitate: Secondary | ICD-10-CM | POA: Diagnosis present

## 2018-07-06 DIAGNOSIS — K219 Gastro-esophageal reflux disease without esophagitis: Secondary | ICD-10-CM | POA: Diagnosis present

## 2018-07-06 DIAGNOSIS — Z79899 Other long term (current) drug therapy: Secondary | ICD-10-CM | POA: Diagnosis not present

## 2018-07-06 DIAGNOSIS — R296 Repeated falls: Secondary | ICD-10-CM | POA: Diagnosis present

## 2018-07-06 DIAGNOSIS — Z8673 Personal history of transient ischemic attack (TIA), and cerebral infarction without residual deficits: Secondary | ICD-10-CM | POA: Diagnosis not present

## 2018-07-06 DIAGNOSIS — R531 Weakness: Secondary | ICD-10-CM | POA: Diagnosis present

## 2018-07-06 DIAGNOSIS — F0390 Unspecified dementia without behavioral disturbance: Secondary | ICD-10-CM | POA: Diagnosis present

## 2018-07-06 DIAGNOSIS — J441 Chronic obstructive pulmonary disease with (acute) exacerbation: Secondary | ICD-10-CM | POA: Diagnosis present

## 2018-07-06 DIAGNOSIS — J9600 Acute respiratory failure, unspecified whether with hypoxia or hypercapnia: Secondary | ICD-10-CM | POA: Diagnosis present

## 2018-07-06 DIAGNOSIS — R42 Dizziness and giddiness: Secondary | ICD-10-CM | POA: Diagnosis present

## 2018-07-06 LAB — BASIC METABOLIC PANEL
Anion gap: 9 (ref 5–15)
BUN: 17 mg/dL (ref 8–23)
CALCIUM: 8.6 mg/dL — AB (ref 8.9–10.3)
CO2: 31 mmol/L (ref 22–32)
CREATININE: 1.12 mg/dL (ref 0.61–1.24)
Chloride: 97 mmol/L — ABNORMAL LOW (ref 98–111)
GFR calc Af Amer: >60 mL/min (ref >60)
GFR calc non Af Amer: >60 mL/min (ref >60)
GLUCOSE: 137 mg/dL — AB (ref 70–99)
Potassium: 3.8 mmol/L (ref 3.5–5.1)
Sodium: 137 mmol/L (ref 135–145)

## 2018-07-06 LAB — CBC
HCT: 35.6 % — ABNORMAL LOW (ref 40.0–52.0)
HEMOGLOBIN: 12.2 g/dL — AB (ref 13.0–18.0)
MCH: 31.2 pg (ref 26.0–34.0)
MCHC: 34.4 g/dL (ref 32.0–36.0)
MCV: 90.9 fL (ref 80.0–100.0)
PLATELETS: 195 10*3/uL (ref 150–440)
RBC: 3.91 MIL/uL — ABNORMAL LOW (ref 4.40–5.90)
RDW: 14.5 % (ref 11.5–14.5)
WBC: 10.7 10*3/uL — ABNORMAL HIGH (ref 3.8–10.6)

## 2018-07-06 LAB — GLUCOSE, CAPILLARY: GLUCOSE-CAPILLARY: 98 mg/dL (ref 70–99)

## 2018-07-06 LAB — LACTIC ACID, PLASMA: LACTIC ACID, VENOUS: 1.4 mmol/L (ref 0.5–1.9)

## 2018-07-06 MED ORDER — IPRATROPIUM-ALBUTEROL 0.5-2.5 (3) MG/3ML IN SOLN
3.0000 mL | Freq: Four times a day (QID) | RESPIRATORY_TRACT | Status: DC
  Administered 2018-07-06 – 2018-07-07 (×4): 3 mL via RESPIRATORY_TRACT
  Filled 2018-07-06 (×4): qty 3

## 2018-07-06 MED ORDER — AMITRIPTYLINE HCL 50 MG PO TABS
50.0000 mg | ORAL_TABLET | Freq: Every day | ORAL | Status: DC
  Administered 2018-07-06 – 2018-07-07 (×2): 50 mg via ORAL
  Filled 2018-07-06 (×2): qty 1

## 2018-07-06 MED ORDER — AMITRIPTYLINE HCL 75 MG PO TABS: 150.0000 mg | ORAL_TABLET | Freq: Two times a day (BID) | ORAL | Status: DC

## 2018-07-06 MED ORDER — DOCUSATE SODIUM 100 MG PO CAPS
100.0000 mg | ORAL_CAPSULE | Freq: Two times a day (BID) | ORAL | Status: DC
  Administered 2018-07-06 – 2018-07-07 (×2): 100 mg via ORAL
  Filled 2018-07-06 (×3): qty 1

## 2018-07-06 MED ORDER — ACETAMINOPHEN 325 MG PO TABS: 650.0000 mg | ORAL_TABLET | Freq: Four times a day (QID) | ORAL | Status: DC | PRN

## 2018-07-06 MED ORDER — HYDROCODONE-ACETAMINOPHEN 5-325 MG PO TABS
1.0000 | ORAL_TABLET | ORAL | Status: DC | PRN
  Administered 2018-07-06 (×2): 2 via ORAL
  Administered 2018-07-07: 1 via ORAL
  Filled 2018-07-06: qty 2
  Filled 2018-07-06: qty 1
  Filled 2018-07-06: qty 2

## 2018-07-06 MED ORDER — CLOPIDOGREL BISULFATE 75 MG PO TABS
75.0000 mg | ORAL_TABLET | Freq: Every day | ORAL | Status: DC
  Administered 2018-07-06 – 2018-07-07 (×2): 75 mg via ORAL
  Filled 2018-07-06 (×2): qty 1

## 2018-07-06 MED ORDER — METHYLPREDNISOLONE SODIUM SUCC 125 MG IJ SOLR
80.0000 mg | Freq: Every day | INTRAMUSCULAR | Status: DC
  Administered 2018-07-07: 80 mg via INTRAVENOUS
  Filled 2018-07-06: qty 2

## 2018-07-06 MED ORDER — ACETAMINOPHEN 650 MG RE SUPP: 650.0000 mg | Freq: Four times a day (QID) | RECTAL | Status: DC | PRN

## 2018-07-06 MED ORDER — DONEPEZIL HCL 5 MG PO TABS
5.0000 mg | ORAL_TABLET | Freq: Every day | ORAL | Status: DC
  Administered 2018-07-06: 5 mg via ORAL
  Filled 2018-07-06 (×3): qty 1

## 2018-07-06 MED ORDER — PHENYTOIN SODIUM EXTENDED 100 MG PO CAPS
100.0000 mg | ORAL_CAPSULE | Freq: Three times a day (TID) | ORAL | Status: DC
  Administered 2018-07-06 – 2018-07-07 (×4): 100 mg via ORAL
  Filled 2018-07-06 (×6): qty 1

## 2018-07-06 MED ORDER — SODIUM CHLORIDE 0.9 % IV SOLN
Freq: Once | INTRAVENOUS | Status: AC
  Administered 2018-07-06: 02:00:00 via INTRAVENOUS

## 2018-07-06 MED ORDER — DOXYCYCLINE HYCLATE 100 MG PO TABS
100.0000 mg | ORAL_TABLET | Freq: Two times a day (BID) | ORAL | Status: DC
  Administered 2018-07-06: 100 mg via ORAL
  Filled 2018-07-06: qty 1

## 2018-07-06 MED ORDER — LEVOFLOXACIN 250 MG PO TABS
250.0000 mg | ORAL_TABLET | Freq: Every day | ORAL | Status: DC
  Administered 2018-07-06 – 2018-07-07 (×2): 250 mg via ORAL
  Filled 2018-07-06 (×2): qty 1

## 2018-07-06 MED ORDER — AMITRIPTYLINE HCL 100 MG PO TABS
100.0000 mg | ORAL_TABLET | Freq: Every day | ORAL | Status: DC
  Administered 2018-07-06: 100 mg via ORAL
  Filled 2018-07-06 (×3): qty 1

## 2018-07-06 MED ORDER — PNEUMOCOCCAL VAC POLYVALENT 25 MCG/0.5ML IJ INJ
0.5000 mL | INJECTION | INTRAMUSCULAR | Status: AC
Start: 2018-07-07 — End: 2018-07-07
  Administered 2018-07-07: 0.5 mL via INTRAMUSCULAR
  Filled 2018-07-06: qty 0.5

## 2018-07-06 MED ORDER — DOXYCYCLINE HYCLATE 100 MG PO TABS: 100.0000 mg | ORAL_TABLET | Freq: Two times a day (BID) | ORAL | Status: DC

## 2018-07-06 MED ORDER — BISACODYL 5 MG PO TBEC: 5.0000 mg | DELAYED_RELEASE_TABLET | Freq: Every day | ORAL | Status: DC | PRN

## 2018-07-06 MED ORDER — TRAZODONE HCL 50 MG PO TABS: 25.0000 mg | ORAL_TABLET | Freq: Every evening | ORAL | Status: DC | PRN

## 2018-07-06 MED ORDER — ONDANSETRON HCL 4 MG/2ML IJ SOLN
4.0000 mg | Freq: Four times a day (QID) | INTRAMUSCULAR | Status: DC | PRN
  Filled 2018-07-06: qty 2

## 2018-07-06 MED ORDER — LEVOFLOXACIN 500 MG PO TABS
500.0000 mg | ORAL_TABLET | Freq: Every day | ORAL | Status: AC
  Administered 2018-07-06: 500 mg via ORAL
  Filled 2018-07-06: qty 1

## 2018-07-06 MED ORDER — ONDANSETRON HCL 4 MG PO TABS: 4.0000 mg | ORAL_TABLET | Freq: Four times a day (QID) | ORAL | Status: DC | PRN

## 2018-07-06 MED ORDER — CEFTRIAXONE SODIUM 1 G IJ SOLR
1.0000 g | INTRAMUSCULAR | Status: DC
  Administered 2018-07-06: 1 g via INTRAVENOUS
  Filled 2018-07-06 (×2): qty 10

## 2018-07-06 MED ORDER — HEPARIN SODIUM (PORCINE) 5000 UNIT/ML IJ SOLN
5000.0000 [IU] | Freq: Three times a day (TID) | INTRAMUSCULAR | Status: DC
  Administered 2018-07-06 – 2018-07-07 (×4): 5000 [IU] via SUBCUTANEOUS
  Filled 2018-07-06 (×4): qty 1

## 2018-07-06 MED ORDER — METHYLPREDNISOLONE SODIUM SUCC 125 MG IJ SOLR
80.0000 mg | Freq: Two times a day (BID) | INTRAMUSCULAR | Status: DC
  Administered 2018-07-06: 80 mg via INTRAVENOUS
  Filled 2018-07-06: qty 2

## 2018-07-06 NOTE — Evaluation (Signed)
Occupational Therapy Evaluation Patient Details Name: Jamie Parsons. MRN: [id number] DOB: 1941-09-24 Today's Date: 07/06/2018    History of Present Illness Patient is a pleasant a 77 y.o. male with a known history of COPD, hyperlipidemia, hypertension, dementia, seizure disorder and chronic tobacco abuse. Patient  presented to ER from home due to worsening shortness of breath weakness and frequent falls.  Patient denies any fevers but is a smoker has been having productive cough for the past several days   Clinical Impression   Pt seen for OT evaluation this date. Prior to hospital admission, pt reports being independent with self care and IADLs including doing laundry and driving to run errands for his wife. However, pt is questionable historian d/t dx dementia.  Pt lives with spouse in single level apartment with level entryway. He reports having a walker, but not needing it.  Currently pt demonstrates impairments in standing balance, ADL transfers and fxl mobility requiring MIN A for LB ADLs in sitting and standing and MIN A for sit<> stand with FWW.  Pt would benefit from skilled OT to address noted impairments and functional limitations (see below for any additional details) in order to maximize safety and independence while minimizing falls risk and caregiver burden.  Upon hospital discharge, recommend pt discharge to home with [facility] and 24/7 supervision for fall prevention.    Follow Up Recommendations  Home health OT;Supervision/Assistance - 24 hour    Equipment Recommendations  Tub/shower seat    Recommendations for Other Services       Precautions / Restrictions Precautions Precautions: Fall Precaution Comments: multiple falls  Restrictions Weight Bearing Restrictions: No      Mobility Bed Mobility Overal bed mobility: Needs Assistance Bed Mobility: Supine to Sit;Sit to Supine    Supine to sit: Min guard Sit to supine: Supervision     Transfers Overall  transfer level: Needs assistance Equipment used: Rolling walker (2 wheeled) Transfers: Sit to/from Stand Sit to Stand: Min assist         General transfer comment: Patient requires Min A to transition from bed to standing with RW. Support for initial push off and then assit with steadying due to preference for trunk flexion.     Balance Overall balance assessment: Needs assistance Sitting-balance support: Feet supported Sitting balance-Leahy Scale: Good  Postural control: Posterior lean Standing balance support: Bilateral upper extremity supported;During functional activity Standing balance-Leahy Scale: Poor Standing balance comment: Requires MIN/MOD verbal cues to widen BOS with turning corners and to stay within RW BOS for safety and fall prevention.                            ADL either performed or assessed with clinical judgement   ADL Overall ADL's : Needs assistance/impaired             Lower Body Bathing: Min guard;Minimal assistance   Upper Body Dressing : Set up;Sitting   Lower Body Dressing: Min guard;Minimal assistance Lower Body Dressing Details (indicate cue type and reason): sitting EOB to don/doff socks, some decreased dynamic sitting balance preventing full indepdnence in task d/t safety concerns.  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 MMT 3-3/5 LUE Deficits / Details: WFL, MMT 4-/5   Lower Extremity Assessment Lower Extremity Assessment: Defer to PT evaluation RLE Deficits / Details: 4-/5  RLE Sensation: decreased proprioception RLE Coordination: decreased gross motor LLE Deficits / Details: 4-/5 LLE Sensation: decreased proprioception LLE Coordination: decreased gross motor       Communication Communication Communication: No difficulties;HOH(some garbled speech, but communication is understandable. )   Cognition Arousal/Alertness: Awake/alert Behavior During Therapy: WFL for tasks assessed/performed Overall Cognitive Status: History of cognitive impairments - at baseline                                 General Comments: Pt with hx dementia, answers questions appropriately, oriented to place and person, able to name season with min verbal cues.    General Comments  No noted wounds or swelling of LE's.     Exercises  Other Exercises Other Exercises: Pt educated on safety and falls prevention strategies including recognizing environmental hazards. Discussed home modifcations for safety. Pt verbalized understanding, but needs reinforcement.  Other Exercises: standing with RW: marches 10x each leg 2 sets, kick outs 10x each leg Other Exercises: rolling L and R for pamper change.    Shoulder Instructions      Home Living Family/patient expects to be discharged to:: Private residence Living Arrangements: Spouse/significant other Available Help at Discharge: Family;Friend(s) Type of Home: Apartment Home Access: Level entry     Home Layout: One level     Bathroom Shower/Tub: Chief Strategy OfficerTub/shower unit   Bathroom Toilet: Handicapped height Bathroom Accessibility: Yes   Home Equipment: Grab bars -  tub/shower;Walker - 2 wheels;Cane - single point;Shower seat - built in   Additional Comments: Patient is a poor historian and source of PLOF information      Prior Functioning/Environment Level of Independence: Independent with assistive device(s)        Comments: Pt drives short distances, gets groceries, and does laundry for his wife.         OT Problem List: Decreased strength;Decreased range of motion;Decreased activity tolerance;Impaired balance (sitting and/or standing);Decreased cognition;Decreased safety awareness;Decreased knowledge of use of DME or AE      OT Treatment/Interventions: Self-care/ADL training;Therapeutic exercise;Therapeutic activities;Patient/family education;Balance training    OT Goals(Current goals can be found in the care plan section) Acute Rehab OT Goals Patient Stated Goal: To return home.  OT Goal Formulation: With patient Time For Goal Achievement: 07/20/18 Potential to Achieve Goals: Good ADL Goals Pt Will Perform Grooming: standing;with supervision(with FWW, sink-side with G-/F+ static balance) Pt Will Transfer to Toilet: with supervision;grab bars(and FWW)  OT Frequency: Min 1X/week   Barriers to D/C:            Co-evaluation              AM-PAC PT "6 Clicks" Daily Activity     Outcome Measure Help from another person eating meals?: None Help from another person taking care of personal  grooming?: A Little Help from another person toileting, which includes using toliet, bedpan, or urinal?: A Little Help from another person bathing (including washing, rinsing, drying)?: A Little Help from another person to put on and taking off regular upper body clothing?: None Help from another person to put on and taking off regular lower body clothing?: A Little 6 Click Score: 20   End of Session Equipment Utilized During Treatment: Gait belt;Rolling walker  Activity Tolerance: Patient tolerated treatment well Patient left: in bed;with  call bell/phone within reach;with bed alarm set  OT Visit Diagnosis: Unsteadiness on feet (R26.81);Other abnormalities of gait and mobility (R26.89);History of falling (Z91.81)                Time: 1610-9604 OT Time Calculation (min): 38 min Charges:  OT General Charges $OT Visit: 1 Visit OT Evaluation $OT Eval Moderate Complexity: 1 Mod OT Treatments $Self Care/Home Management : 23-37 mins  Rejeana Brock, MS, OTR/L ascom [phone] or [phone] 07/06/18, 4:28 PM
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
## 2018-07-06 NOTE — Progress Notes (Signed)
PT Cancellation Note  Patient Details Name: Nicole CellaDavid James Davitt Jr. MRN: [id number] DOB: 24-May-1941   Cancelled Treatment:    Reason Eval/Treat Not Completed: Patient declined, no reason specified(Patient declined, wants to eat breakfast first. Will return later. ) Patient declines due to just getting his breakfast. Will attempt again at a later time.   Precious BardMarina Clarice Zulauf, PT, DPT   07/06/2018, 8:47 AM

## 2018-07-06 NOTE — Progress Notes (Signed)
Patient's wife called and requested to speak with Dr. Cherlynn KaiserSainani. Dr. Cherlynn KaiserSainani notified via text page. Dr. Cherlynn KaiserSainani called nurse and notified her that he attempted to contact wife and there was no answer. Nurse will notify wife that provider tried contacting her if she calls back.

## 2018-07-06 NOTE — Progress Notes (Signed)
Chaplain responded to an OR for an AD. Pt was in considerable pain. Chaplain said he would return at t better time. Chaplain offered prayer and prayed for this world. Chaplain will return later   07/06/18 0900  Clinical Encounter Type  Visited With Patient  Visit Type Initial  Referral From Nurse  Spiritual Encounters  Spiritual Needs Brochure;Prayer

## 2018-07-06 NOTE — ED Notes (Signed)
Report called to trisha rn floor nurse 

## 2018-07-06 NOTE — Progress Notes (Signed)
Chaplain followed up with Pt. Pt was alert and talkative. He gave a life review and talked about things that he loved. He talked about events in his life and family. Chaplain practiced active listening  An a non-anxious presence. This pt enjoys telling his story. Chaplain will follow up.    07/06/18 1500  Clinical Encounter Type  Visited With Patient  Visit Type Follow-up  Referral From Nurse  Spiritual Encounters  Spiritual Needs Brochure;Prayer

## 2018-07-06 NOTE — Evaluation (Signed)
Physical Therapy Evaluation Patient Details Name: Jamie CellaDavid James Leazer Jr. MRN: [id number] DOB: [birth year] Today's Date: 07/06/2018   History of Present Illness  Patient is a pleasant a 77 y.o. male with a known history of COPD, hyperlipidemia, hypertension, dementia, seizure disorder and chronic tobacco abuse. Patient  presented to ER from home due to worsening shortness of breath weakness and frequent falls.  Patient denies any fevers but is a smoker has been having productive cough for the past several days  Clinical Impression  Patient is a pleasant 77 year old male who presents with generalized weakness and instability with concurrent dementia resulting in confusion and poor command follow. Patient requires simple commands for follow and additional time for understanding. Frequently trails off in conversation and is noted to be upset/depressed when discussing home situation. Reports being home with wife who he helps care for. Patient requires assistance for rolling R and L in bed as well as performing STS from lowered surface. Upon standing patient is able to ambulate around nursing station and in room ~180 ft with RW and CGA without difficulty. Due to difficulty with transitions, weakness of LE's, instability without UE support, and poor cognition patient would benefit from continued skilled physical therapy while in hospital and home health physical therapy upon discharge with additional supervision/assistance at home for safe mobility and decreased fall risk.     Follow Up Recommendations Home health PT;Supervision/Assistance - 24 hour(will require home aide for assistance. )    Equipment Recommendations       Recommendations for Other Services       Precautions / Restrictions Precautions Precautions: Fall Precaution Comments: multiple falls  Restrictions Weight Bearing Restrictions: No      Mobility  Bed Mobility Overal bed mobility: Needs Assistance Bed Mobility: Rolling;Supine  to Sit;Sit to Supine Rolling: Min assist   Supine to sit: Modified independent (Device/Increase time) Sit to supine: Modified independent (Device/Increase time)   General bed mobility comments: Require Min A to roll R and L for pamper donning. Able to perform supine <>sit with increased time.  Transfers Overall transfer level: Needs assistance Equipment used: Standard walker Transfers: Sit to/from Stand Sit to Stand: Min assist         General transfer comment: Patient requires Min A to transition from bed to standing with RW. Support for initial push off and then assit with steadying due to preference for trunk flexion.   Ambulation/Gait Ambulation/Gait assistance: Min guard Gait Distance (Feet): 180 Feet Assistive device: Standard walker Gait Pattern/deviations: Step-through pattern;Trunk flexed;Shuffle;Decreased stride length     General Gait Details: forward flexion with head down, slight knee flexion in stance phase due to weak quadricep musculature.   Stairs            Wheelchair Mobility    Modified Rankin (Stroke Patients Only)       Balance Overall balance assessment: Needs assistance Sitting-balance support: Feet supported Sitting balance-Leahy Scale: Good Sitting balance - Comments: Good seated balance reaching inside and outside BOS  Postural control: Posterior lean Standing balance support: Bilateral upper extremity supported;During functional activity Standing balance-Leahy Scale: Poor Standing balance comment: Requires BUE assistance for ambulation due to excessive forward lean altering COM combined with narrow BOS                              Pertinent Vitals/Pain Pain Assessment: No/denies pain    Home Living Family/patient expects to be discharged to:: Private residence  Living Arrangements: Spouse/significant other Available Help at Discharge: Family;Friend(s) Type of Home: Apartment Home Access: Level entry     Home  Layout: One level Home Equipment: Grab bars - tub/shower;Walker - 2 wheels;Cane - single point;Shower seat - built in Additional Comments: Patient is a poor historian due to his dementia.     Prior Function Level of Independence: Independent with assistive device(s)         Comments: Pt drives, runs errands, generally can be very active per patient. Patient reports he has to help take care of his wife.      Hand Dominance   Dominant Hand: Right    Extremity/Trunk Assessment   Upper Extremity Assessment Upper Extremity Assessment: Generalized weakness    Lower Extremity Assessment Lower Extremity Assessment: RLE deficits/detail;LLE deficits/detail RLE Deficits / Details: 4-/5  RLE Sensation: decreased proprioception RLE Coordination: decreased gross motor LLE Deficits / Details: 4-/5 LLE Sensation: decreased proprioception LLE Coordination: decreased gross motor       Communication   Communication: No difficulties;HOH  Cognition Arousal/Alertness: Awake/alert Behavior During Therapy: WFL for tasks assessed/performed Overall Cognitive Status: History of cognitive impairments - at baseline                                 General Comments: Patient has history of dementia. Patient states he is depressed and misses his wife.       General Comments General comments (skin integrity, edema, etc.): No noted wounds or swelling of LE's.     Exercises General Exercises - Lower Extremity Ankle Circles/Pumps: Strengthening;Both;10 reps;Supine Heel Slides: Strengthening;Both;10 reps;Supine Hip ABduction/ADduction: Strengthening;Both;10 reps;Supine Straight Leg Raises: AROM;10 reps;Supine;Strengthening Hip Flexion/Marching: Strengthening;Both;10 reps;Standing(with RW x 2 sets) Other Exercises Other Exercises: standing tolerance with RW : 2 minutes static stand Other Exercises: standing with RW: marches 10x each leg 2 sets, kick outs 10x each leg Other Exercises:  rolling L and R for pamper change.    Assessment/Plan    PT Assessment Patient needs continued PT services  PT Problem List Decreased strength;Decreased activity tolerance;Decreased balance;Decreased knowledge of use of DME;Decreased coordination;Decreased cognition;Decreased mobility;Decreased safety awareness;Decreased knowledge of precautions       PT Treatment Interventions DME instruction;Gait training;Functional mobility training;Neuromuscular re-education;Balance training;Therapeutic exercise;Therapeutic activities;Patient/family education    PT Goals (Current goals can be found in the Care Plan section)  Acute Rehab PT Goals Patient Stated Goal: To return home.  PT Goal Formulation: With patient Time For Goal Achievement: 07/20/18 Potential to Achieve Goals: Fair    Frequency Min 2X/week   Barriers to discharge Decreased caregiver support will require home aide if returning to home.     Co-evaluation               AM-PAC PT "6 Clicks" Daily Activity  Outcome Measure Difficulty turning over in bed (including adjusting bedclothes, sheets and blankets)?: Unable Difficulty moving from lying on back to sitting on the side of the bed? : A Little Difficulty sitting down on and standing up from a chair with arms (e.g., wheelchair, bedside commode, etc,.)?: Unable Help needed moving to and from a bed to chair (including a wheelchair)?: A Little Help needed walking in hospital room?: None Help needed climbing 3-5 steps with a railing? : A Little 6 Click Score: 15    End of Session Equipment Utilized During Treatment: Gait belt Activity Tolerance: Patient tolerated treatment well Patient left: in bed;with bed alarm set Nurse Communication: Mobility  status PT Visit Diagnosis: Unsteadiness on feet (R26.81);Other abnormalities of gait and mobility (R26.89);Repeated falls (R29.6);Muscle weakness (generalized) (M62.81)    Time: 6433-2951 PT Time Calculation (min) (ACUTE  ONLY): 31 min   Charges:   PT Evaluation $PT Eval Low Complexity: 1 Low PT Treatments $Gait Training: 8-22 mins        Precious Bard, PT, DPT    Precious Bard 07/06/2018, 1:26 PM
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
## 2018-07-07 MED ORDER — IPRATROPIUM-ALBUTEROL 0.5-2.5 (3) MG/3ML IN SOLN: 3.0000 mL | Freq: Two times a day (BID) | RESPIRATORY_TRACT | Status: DC

## 2018-07-07 MED ORDER — PREDNISONE 10 MG PO TABS: ORAL_TABLET | ORAL | 0 refills | Status: DC

## 2018-07-07 MED ORDER — LEVOFLOXACIN 250 MG PO TABS: 250.0000 mg | ORAL_TABLET | Freq: Every day | ORAL | 0 refills | Status: AC

## 2018-07-07 MED ORDER — IPRATROPIUM-ALBUTEROL 0.5-2.5 (3) MG/3ML IN SOLN
3.0000 mL | Freq: Three times a day (TID) | RESPIRATORY_TRACT | Status: DC
  Administered 2018-07-07: 3 mL via RESPIRATORY_TRACT
  Filled 2018-07-07: qty 3
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
## 2018-07-07 NOTE — Care Management Note (Signed)
Case Management Note  Patient Details  Name: Jamie CellaDavid James Maish Jr. MRN: [id number] Date of Birth: 1941-04-18  Subjective/Objective:  Patient to be discharged per MD order. Orders in place for home health services. Per patient preference will use [facility]. Referral placed with Vaughan BastaJermaine for PT, RN and social work services. Patient lives with spouse and has a walker and cane available in the home, no other DME needs. Spouse to provide transport home.   Buddy DutyJosh Wendee Hata RN BSN RNCM [phone]                 Action/Plan:   Expected Discharge Date:  07/07/18               Expected Discharge Plan:  Home w Home Health Services  In-House Referral:     Discharge planning Services  CM Consult  Post Acute Care Choice:  Home Health Choice offered to:  Patient  DME Arranged:    DME Agency:     HH Arranged:  RN, PT, Social Work [facility]  [facility]  Status of Service:  Completed, signed off  If discussed at [employer] Meetings, dates discussed:    Additional Comments:  Virgel ManifoldJosh A Carsyn Boster, RN 07/07/2018, 11:34 AM

## 2018-07-07 NOTE — Progress Notes (Signed)
Discharge teaching given to patient, patient verbalized understanding and had no questions. Patient IV removed. Patient will be transported home by family. All patient belongings gathered prior to leaving.  

## 2018-07-17 DIAGNOSIS — Z9181 History of falling: Secondary | ICD-10-CM | POA: Diagnosis not present

## 2018-07-17 DIAGNOSIS — Z720 Tobacco use: Secondary | ICD-10-CM | POA: Diagnosis not present

## 2018-07-17 DIAGNOSIS — R531 Weakness: Secondary | ICD-10-CM | POA: Diagnosis not present

## 2018-07-17 DIAGNOSIS — G40909 Epilepsy, unspecified, not intractable, without status epilepticus: Secondary | ICD-10-CM | POA: Diagnosis not present

## 2018-07-17 DIAGNOSIS — I10 Essential (primary) hypertension: Secondary | ICD-10-CM | POA: Diagnosis not present

## 2018-07-17 DIAGNOSIS — Z7952 Long term (current) use of systemic steroids: Secondary | ICD-10-CM | POA: Diagnosis not present

## 2018-07-17 DIAGNOSIS — E785 Hyperlipidemia, unspecified: Secondary | ICD-10-CM | POA: Diagnosis not present

## 2018-07-17 DIAGNOSIS — F0390 Unspecified dementia without behavioral disturbance: Secondary | ICD-10-CM | POA: Diagnosis not present

## 2018-07-17 DIAGNOSIS — Z8673 Personal history of transient ischemic attack (TIA), and cerebral infarction without residual deficits: Secondary | ICD-10-CM | POA: Diagnosis not present

## 2018-07-17 DIAGNOSIS — J441 Chronic obstructive pulmonary disease with (acute) exacerbation: Secondary | ICD-10-CM | POA: Diagnosis not present

## 2018-07-17 DIAGNOSIS — J9600 Acute respiratory failure, unspecified whether with hypoxia or hypercapnia: Secondary | ICD-10-CM | POA: Diagnosis not present

## 2018-07-17 DIAGNOSIS — K219 Gastro-esophageal reflux disease without esophagitis: Secondary | ICD-10-CM | POA: Diagnosis not present

## 2018-07-19 DIAGNOSIS — R0781 Pleurodynia: Secondary | ICD-10-CM | POA: Diagnosis not present

## 2018-07-19 DIAGNOSIS — R29898 Other symptoms and signs involving the musculoskeletal system: Secondary | ICD-10-CM | POA: Diagnosis not present

## 2018-07-19 DIAGNOSIS — J441 Chronic obstructive pulmonary disease with (acute) exacerbation: Secondary | ICD-10-CM | POA: Diagnosis not present

## 2018-07-19 DIAGNOSIS — R0789 Other chest pain: Secondary | ICD-10-CM | POA: Diagnosis not present

## 2018-07-31 DIAGNOSIS — I10 Essential (primary) hypertension: Secondary | ICD-10-CM | POA: Diagnosis not present

## 2018-07-31 DIAGNOSIS — F0390 Unspecified dementia without behavioral disturbance: Secondary | ICD-10-CM | POA: Diagnosis not present

## 2018-07-31 DIAGNOSIS — J441 Chronic obstructive pulmonary disease with (acute) exacerbation: Secondary | ICD-10-CM | POA: Diagnosis not present

## 2018-07-31 DIAGNOSIS — G40909 Epilepsy, unspecified, not intractable, without status epilepticus: Secondary | ICD-10-CM | POA: Diagnosis not present

## 2018-07-31 DIAGNOSIS — J9600 Acute respiratory failure, unspecified whether with hypoxia or hypercapnia: Secondary | ICD-10-CM | POA: Diagnosis not present

## 2018-07-31 DIAGNOSIS — K219 Gastro-esophageal reflux disease without esophagitis: Secondary | ICD-10-CM | POA: Diagnosis not present

## 2018-07-31 DIAGNOSIS — R531 Weakness: Secondary | ICD-10-CM | POA: Diagnosis not present

## 2018-07-31 DIAGNOSIS — Z9181 History of falling: Secondary | ICD-10-CM | POA: Diagnosis not present

## 2018-07-31 DIAGNOSIS — Z720 Tobacco use: Secondary | ICD-10-CM | POA: Diagnosis not present

## 2018-07-31 DIAGNOSIS — Z7952 Long term (current) use of systemic steroids: Secondary | ICD-10-CM | POA: Diagnosis not present

## 2018-07-31 DIAGNOSIS — Z8673 Personal history of transient ischemic attack (TIA), and cerebral infarction without residual deficits: Secondary | ICD-10-CM | POA: Diagnosis not present

## 2018-08-15 DIAGNOSIS — Z8673 Personal history of transient ischemic attack (TIA), and cerebral infarction without residual deficits: Secondary | ICD-10-CM | POA: Diagnosis not present

## 2018-08-15 DIAGNOSIS — J441 Chronic obstructive pulmonary disease with (acute) exacerbation: Secondary | ICD-10-CM | POA: Diagnosis not present

## 2018-08-15 DIAGNOSIS — Z7952 Long term (current) use of systemic steroids: Secondary | ICD-10-CM | POA: Diagnosis not present

## 2018-08-15 DIAGNOSIS — R531 Weakness: Secondary | ICD-10-CM | POA: Diagnosis not present

## 2018-08-15 DIAGNOSIS — Z720 Tobacco use: Secondary | ICD-10-CM | POA: Diagnosis not present

## 2018-08-15 DIAGNOSIS — J9600 Acute respiratory failure, unspecified whether with hypoxia or hypercapnia: Secondary | ICD-10-CM | POA: Diagnosis not present

## 2018-08-15 DIAGNOSIS — F0390 Unspecified dementia without behavioral disturbance: Secondary | ICD-10-CM | POA: Diagnosis not present

## 2018-08-15 DIAGNOSIS — E785 Hyperlipidemia, unspecified: Secondary | ICD-10-CM | POA: Diagnosis not present

## 2018-08-15 DIAGNOSIS — Z9181 History of falling: Secondary | ICD-10-CM | POA: Diagnosis not present

## 2018-08-15 DIAGNOSIS — K219 Gastro-esophageal reflux disease without esophagitis: Secondary | ICD-10-CM | POA: Diagnosis not present

## 2018-08-15 DIAGNOSIS — G40909 Epilepsy, unspecified, not intractable, without status epilepticus: Secondary | ICD-10-CM | POA: Diagnosis not present

## 2018-08-15 DIAGNOSIS — I10 Essential (primary) hypertension: Secondary | ICD-10-CM | POA: Diagnosis not present

## 2019-02-28 DIAGNOSIS — J418 Mixed simple and mucopurulent chronic bronchitis: Secondary | ICD-10-CM | POA: Diagnosis not present

## 2019-02-28 DIAGNOSIS — F3341 Major depressive disorder, recurrent, in partial remission: Secondary | ICD-10-CM | POA: Diagnosis not present

## 2019-02-28 DIAGNOSIS — Z0000 Encounter for general adult medical examination without abnormal findings: Secondary | ICD-10-CM | POA: Diagnosis not present

## 2019-02-28 DIAGNOSIS — I7300 Raynaud's syndrome without gangrene: Secondary | ICD-10-CM | POA: Diagnosis not present

## 2019-02-28 DIAGNOSIS — I10 Essential (primary) hypertension: Secondary | ICD-10-CM | POA: Diagnosis not present

## 2019-02-28 DIAGNOSIS — G40909 Epilepsy, unspecified, not intractable, without status epilepticus: Secondary | ICD-10-CM | POA: Diagnosis not present

## 2019-02-28 DIAGNOSIS — R7302 Impaired glucose tolerance (oral): Secondary | ICD-10-CM | POA: Diagnosis not present

## 2019-02-28 DIAGNOSIS — E7800 Pure hypercholesterolemia, unspecified: Secondary | ICD-10-CM | POA: Diagnosis not present

## 2019-03-03 DIAGNOSIS — R7302 Impaired glucose tolerance (oral): Secondary | ICD-10-CM | POA: Diagnosis not present

## 2019-03-03 DIAGNOSIS — E7800 Pure hypercholesterolemia, unspecified: Secondary | ICD-10-CM | POA: Diagnosis not present

## 2019-07-13 DIAGNOSIS — Z4800 Encounter for change or removal of nonsurgical wound dressing: Secondary | ICD-10-CM | POA: Diagnosis not present

## 2019-07-13 DIAGNOSIS — Z8673 Personal history of transient ischemic attack (TIA), and cerebral infarction without residual deficits: Secondary | ICD-10-CM | POA: Diagnosis not present

## 2019-07-13 DIAGNOSIS — E785 Hyperlipidemia, unspecified: Secondary | ICD-10-CM | POA: Diagnosis not present

## 2019-07-13 DIAGNOSIS — J449 Chronic obstructive pulmonary disease, unspecified: Secondary | ICD-10-CM | POA: Diagnosis not present

## 2019-07-13 DIAGNOSIS — I509 Heart failure, unspecified: Secondary | ICD-10-CM | POA: Diagnosis not present

## 2019-07-13 DIAGNOSIS — G40909 Epilepsy, unspecified, not intractable, without status epilepticus: Secondary | ICD-10-CM | POA: Diagnosis not present

## 2019-07-13 DIAGNOSIS — Z87891 Personal history of nicotine dependence: Secondary | ICD-10-CM | POA: Diagnosis not present

## 2019-07-13 DIAGNOSIS — K219 Gastro-esophageal reflux disease without esophagitis: Secondary | ICD-10-CM | POA: Diagnosis not present

## 2019-07-13 DIAGNOSIS — I7300 Raynaud's syndrome without gangrene: Secondary | ICD-10-CM | POA: Diagnosis not present

## 2019-07-13 DIAGNOSIS — L89210 Pressure ulcer of right hip, unstageable: Secondary | ICD-10-CM | POA: Diagnosis not present

## 2019-07-13 DIAGNOSIS — Z7902 Long term (current) use of antithrombotics/antiplatelets: Secondary | ICD-10-CM | POA: Diagnosis not present

## 2019-07-13 DIAGNOSIS — F3341 Major depressive disorder, recurrent, in partial remission: Secondary | ICD-10-CM | POA: Diagnosis not present

## 2019-07-13 DIAGNOSIS — Z9181 History of falling: Secondary | ICD-10-CM | POA: Diagnosis not present

## 2019-07-13 DIAGNOSIS — I110 Hypertensive heart disease with heart failure: Secondary | ICD-10-CM | POA: Diagnosis not present

## 2019-07-15 DIAGNOSIS — R05 Cough: Secondary | ICD-10-CM | POA: Diagnosis not present

## 2019-07-16 DIAGNOSIS — E785 Hyperlipidemia, unspecified: Secondary | ICD-10-CM | POA: Diagnosis not present

## 2019-07-16 DIAGNOSIS — I509 Heart failure, unspecified: Secondary | ICD-10-CM | POA: Diagnosis not present

## 2019-07-16 DIAGNOSIS — Z9181 History of falling: Secondary | ICD-10-CM | POA: Diagnosis not present

## 2019-07-16 DIAGNOSIS — Z7902 Long term (current) use of antithrombotics/antiplatelets: Secondary | ICD-10-CM | POA: Diagnosis not present

## 2019-07-16 DIAGNOSIS — I110 Hypertensive heart disease with heart failure: Secondary | ICD-10-CM | POA: Diagnosis not present

## 2019-07-16 DIAGNOSIS — Z87891 Personal history of nicotine dependence: Secondary | ICD-10-CM | POA: Diagnosis not present

## 2019-07-16 DIAGNOSIS — F3341 Major depressive disorder, recurrent, in partial remission: Secondary | ICD-10-CM | POA: Diagnosis not present

## 2019-07-16 DIAGNOSIS — Z4800 Encounter for change or removal of nonsurgical wound dressing: Secondary | ICD-10-CM | POA: Diagnosis not present

## 2019-07-16 DIAGNOSIS — K219 Gastro-esophageal reflux disease without esophagitis: Secondary | ICD-10-CM | POA: Diagnosis not present

## 2019-07-16 DIAGNOSIS — I7300 Raynaud's syndrome without gangrene: Secondary | ICD-10-CM | POA: Diagnosis not present

## 2019-07-16 DIAGNOSIS — Z8673 Personal history of transient ischemic attack (TIA), and cerebral infarction without residual deficits: Secondary | ICD-10-CM | POA: Diagnosis not present

## 2019-07-16 DIAGNOSIS — G40909 Epilepsy, unspecified, not intractable, without status epilepticus: Secondary | ICD-10-CM | POA: Diagnosis not present

## 2019-07-16 DIAGNOSIS — L89210 Pressure ulcer of right hip, unstageable: Secondary | ICD-10-CM | POA: Diagnosis not present

## 2019-07-16 DIAGNOSIS — J449 Chronic obstructive pulmonary disease, unspecified: Secondary | ICD-10-CM | POA: Diagnosis not present

## 2019-08-27 DIAGNOSIS — F3341 Major depressive disorder, recurrent, in partial remission: Secondary | ICD-10-CM | POA: Diagnosis not present

## 2019-08-27 DIAGNOSIS — I10 Essential (primary) hypertension: Secondary | ICD-10-CM | POA: Diagnosis not present

## 2019-08-27 DIAGNOSIS — G40909 Epilepsy, unspecified, not intractable, without status epilepticus: Secondary | ICD-10-CM | POA: Diagnosis not present

## 2019-08-27 DIAGNOSIS — I7300 Raynaud's syndrome without gangrene: Secondary | ICD-10-CM | POA: Diagnosis not present

## 2019-08-27 DIAGNOSIS — I509 Heart failure, unspecified: Secondary | ICD-10-CM | POA: Diagnosis not present

## 2019-08-27 DIAGNOSIS — E785 Hyperlipidemia, unspecified: Secondary | ICD-10-CM | POA: Diagnosis not present

## 2019-08-27 DIAGNOSIS — L89210 Pressure ulcer of right hip, unstageable: Secondary | ICD-10-CM | POA: Diagnosis not present

## 2019-08-27 DIAGNOSIS — J449 Chronic obstructive pulmonary disease, unspecified: Secondary | ICD-10-CM | POA: Diagnosis not present

## 2019-09-01 DIAGNOSIS — G40909 Epilepsy, unspecified, not intractable, without status epilepticus: Secondary | ICD-10-CM | POA: Diagnosis not present

## 2019-09-01 DIAGNOSIS — R350 Frequency of micturition: Secondary | ICD-10-CM | POA: Diagnosis not present

## 2019-09-01 DIAGNOSIS — F3341 Major depressive disorder, recurrent, in partial remission: Secondary | ICD-10-CM | POA: Diagnosis not present

## 2019-09-01 DIAGNOSIS — R7302 Impaired glucose tolerance (oral): Secondary | ICD-10-CM | POA: Diagnosis not present

## 2019-09-01 DIAGNOSIS — N401 Enlarged prostate with lower urinary tract symptoms: Secondary | ICD-10-CM | POA: Diagnosis not present

## 2019-09-01 DIAGNOSIS — Z8781 Personal history of (healed) traumatic fracture: Secondary | ICD-10-CM | POA: Diagnosis not present

## 2019-09-01 DIAGNOSIS — J418 Mixed simple and mucopurulent chronic bronchitis: Secondary | ICD-10-CM | POA: Diagnosis not present

## 2019-09-01 DIAGNOSIS — I10 Essential (primary) hypertension: Secondary | ICD-10-CM | POA: Diagnosis not present

## 2019-09-01 DIAGNOSIS — I7300 Raynaud's syndrome without gangrene: Secondary | ICD-10-CM | POA: Diagnosis not present

## 2019-09-01 DIAGNOSIS — E7800 Pure hypercholesterolemia, unspecified: Secondary | ICD-10-CM | POA: Diagnosis not present

## 2019-09-01 DIAGNOSIS — Z23 Encounter for immunization: Secondary | ICD-10-CM | POA: Diagnosis not present

## 2019-09-03 DIAGNOSIS — R7302 Impaired glucose tolerance (oral): Secondary | ICD-10-CM | POA: Diagnosis not present

## 2019-09-03 DIAGNOSIS — E7800 Pure hypercholesterolemia, unspecified: Secondary | ICD-10-CM | POA: Diagnosis not present

## 2020-02-05 ENCOUNTER — Emergency Department
Admission: EM | Admit: 2020-02-05 | Discharge: 2020-02-05 | Disposition: A | Discharge status: Home / Self Care | Payer: PPO | Attending: Student | Admitting: Student

## 2020-02-05 ENCOUNTER — Emergency Department: Payer: PPO

## 2020-02-05 ENCOUNTER — Other Ambulatory Visit: Payer: Self-pay

## 2020-02-05 DIAGNOSIS — R52 Pain, unspecified: Secondary | ICD-10-CM | POA: Diagnosis not present

## 2020-02-05 DIAGNOSIS — G8929 Other chronic pain: Secondary | ICD-10-CM | POA: Diagnosis not present

## 2020-02-05 DIAGNOSIS — Y92410 Unspecified street and highway as the place of occurrence of the external cause: Secondary | ICD-10-CM | POA: Diagnosis not present

## 2020-02-05 DIAGNOSIS — J449 Chronic obstructive pulmonary disease, unspecified: Secondary | ICD-10-CM | POA: Insufficient documentation

## 2020-02-05 DIAGNOSIS — Z79899 Other long term (current) drug therapy: Secondary | ICD-10-CM | POA: Diagnosis not present

## 2020-02-05 DIAGNOSIS — I10 Essential (primary) hypertension: Secondary | ICD-10-CM | POA: Insufficient documentation

## 2020-02-05 DIAGNOSIS — F17210 Nicotine dependence, cigarettes, uncomplicated: Secondary | ICD-10-CM | POA: Insufficient documentation

## 2020-02-05 DIAGNOSIS — M25511 Pain in right shoulder: Secondary | ICD-10-CM | POA: Diagnosis not present

## 2020-02-05 DIAGNOSIS — M25519 Pain in unspecified shoulder: Secondary | ICD-10-CM | POA: Diagnosis not present

## 2020-02-05 DIAGNOSIS — Z8673 Personal history of transient ischemic attack (TIA), and cerebral infarction without residual deficits: Secondary | ICD-10-CM | POA: Insufficient documentation

## 2020-02-05 DIAGNOSIS — Y9389 Activity, other specified: Secondary | ICD-10-CM | POA: Insufficient documentation

## 2020-02-05 DIAGNOSIS — Y998 Other external cause status: Secondary | ICD-10-CM | POA: Insufficient documentation

## 2020-02-05 DIAGNOSIS — R0902 Hypoxemia: Secondary | ICD-10-CM | POA: Diagnosis not present

## 2020-02-05 DIAGNOSIS — R001 Bradycardia, unspecified: Secondary | ICD-10-CM | POA: Diagnosis not present

## 2020-02-05 MED ORDER — TRAMADOL HCL 50 MG PO TABS
50.0000 mg | ORAL_TABLET | Freq: Once | ORAL | Status: AC
  Administered 2020-02-05: 14:00:00 50 mg via ORAL
  Filled 2020-02-05: qty 1

## 2020-02-05 MED ORDER — TRAMADOL HCL 50 MG PO TABS: 50.0000 mg | ORAL_TABLET | Freq: Two times a day (BID) | ORAL | 0 refills | Status: AC

## 2020-02-05 NOTE — ED Notes (Signed)
Attempted to call wife of pt for ride. No answer at this time.
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
## 2020-02-05 NOTE — ED Notes (Signed)
Spoke with wife and she stated she was trying to locate a ride for pt. Informed wife of option of taxi. Wife to call back shortly.
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
## 2020-03-04 DIAGNOSIS — E7800 Pure hypercholesterolemia, unspecified: Secondary | ICD-10-CM | POA: Diagnosis not present

## 2020-03-04 DIAGNOSIS — I10 Essential (primary) hypertension: Secondary | ICD-10-CM | POA: Diagnosis not present

## 2020-03-04 DIAGNOSIS — Z0000 Encounter for general adult medical examination without abnormal findings: Secondary | ICD-10-CM | POA: Diagnosis not present

## 2020-03-04 DIAGNOSIS — I7300 Raynaud's syndrome without gangrene: Secondary | ICD-10-CM | POA: Diagnosis not present

## 2020-03-04 DIAGNOSIS — F3341 Major depressive disorder, recurrent, in partial remission: Secondary | ICD-10-CM | POA: Diagnosis not present

## 2020-03-04 DIAGNOSIS — Z8781 Personal history of (healed) traumatic fracture: Secondary | ICD-10-CM | POA: Diagnosis not present

## 2020-03-04 DIAGNOSIS — N401 Enlarged prostate with lower urinary tract symptoms: Secondary | ICD-10-CM | POA: Diagnosis not present

## 2020-03-04 DIAGNOSIS — R7302 Impaired glucose tolerance (oral): Secondary | ICD-10-CM | POA: Diagnosis not present

## 2020-03-04 DIAGNOSIS — G40909 Epilepsy, unspecified, not intractable, without status epilepticus: Secondary | ICD-10-CM | POA: Diagnosis not present

## 2020-03-04 DIAGNOSIS — R350 Frequency of micturition: Secondary | ICD-10-CM | POA: Diagnosis not present

## 2020-03-04 DIAGNOSIS — J418 Mixed simple and mucopurulent chronic bronchitis: Secondary | ICD-10-CM | POA: Diagnosis not present

## 2020-04-02 ENCOUNTER — Other Ambulatory Visit: Payer: Self-pay | Admitting: Gerontology

## 2020-04-02 ENCOUNTER — Ambulatory Visit: Payer: PPO

## 2020-04-02 DIAGNOSIS — I739 Peripheral vascular disease, unspecified: Secondary | ICD-10-CM | POA: Diagnosis not present

## 2020-04-05 ENCOUNTER — Ambulatory Visit: Admission: RE | Admit: 2020-04-05 | Payer: PPO | Source: Ambulatory Visit

## 2020-04-05 ENCOUNTER — Ambulatory Visit: Payer: PPO

## 2020-04-06 ENCOUNTER — Ambulatory Visit: Payer: PPO

## 2020-04-07 IMAGING — CT CT ANGIO CHEST-ABD-PELV FOR DISSECTION W/ AND WO/W CM
2 of 7 series · 12 of 46 positions shown, 14 images · IV contrast (APPLIED)
Comparison: Chest radiograph performed earlier today at [DATE] a.m.,
CT of the chest performed 11/06/2014, and CT of the abdomen and
pelvis performed 12/31/2017

CLINICAL DATA: Acute onset of intermittent generalized chest pain,
radiating to the back.

EXAM:
CT ANGIOGRAPHY CHEST, ABDOMEN AND PELVIS
TECHNIQUE: Multidetector CT imaging through the chest, abdomen and pelvis was
performed using the standard protocol during bolus administration of
intravenous contrast. Multiplanar reconstructed images and MIPs were
obtained and reviewed to evaluate the vascular anatomy.
CONTRAST:  125mL [id number] IOPAMIDOL ([id number]) INJECTION 76%

[Series 6: axial arterial · axial · arterial · 0.68mm/px · z∈[-1079,-482]mm · 9 of 231 slices shown, 11 images]
[im 16/231  soft-tissue]
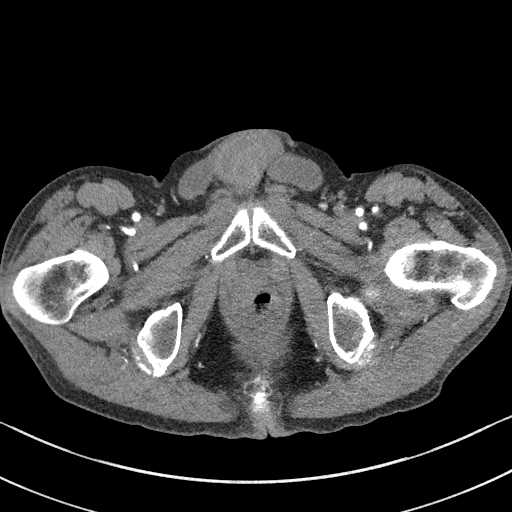
[im 16/231  bone]
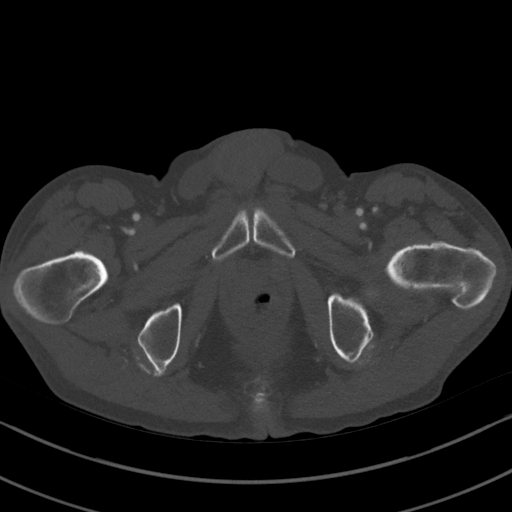
[im 47/231  soft-tissue]
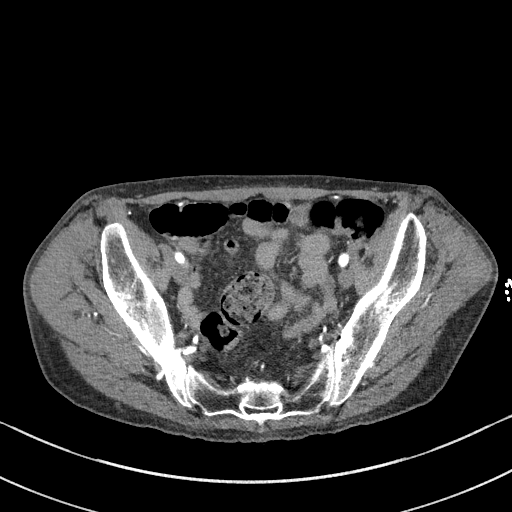
[im 62/231  soft-tissue]
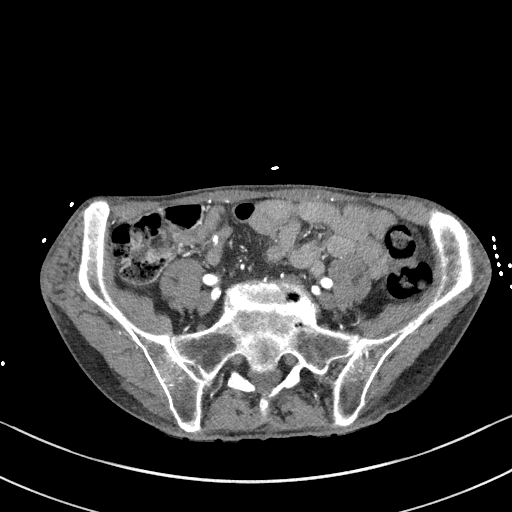
[im 93/231  soft-tissue]
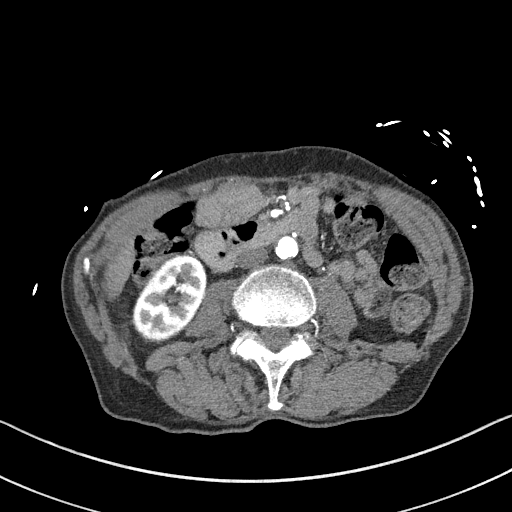
[im 123/231  soft-tissue]
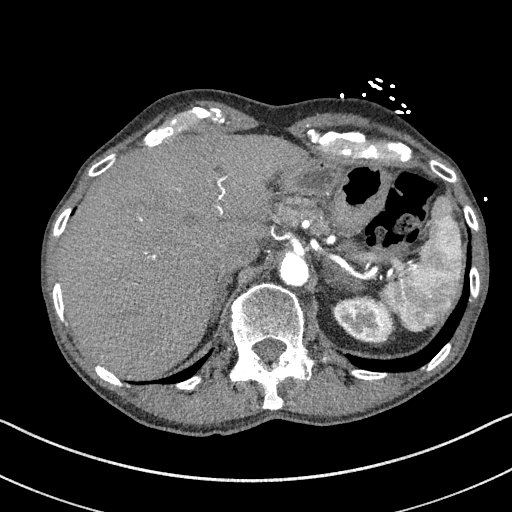
[im 139/231  soft-tissue]
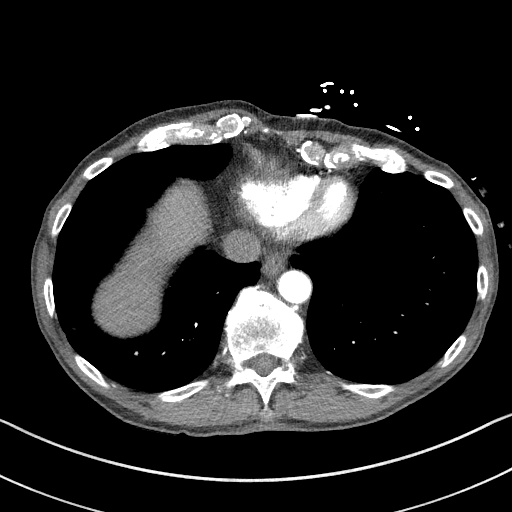
[im 169/231  soft-tissue]
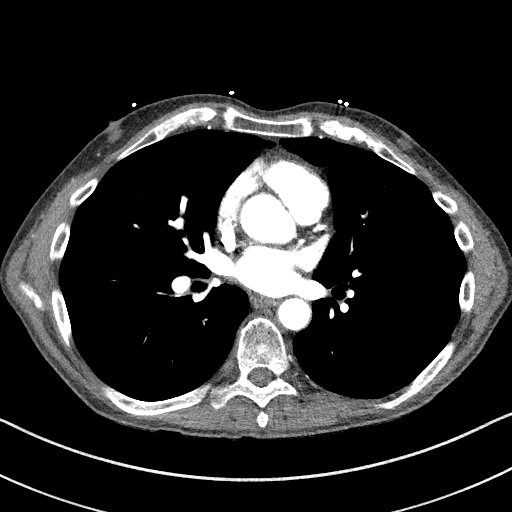
[im 185/231  soft-tissue]
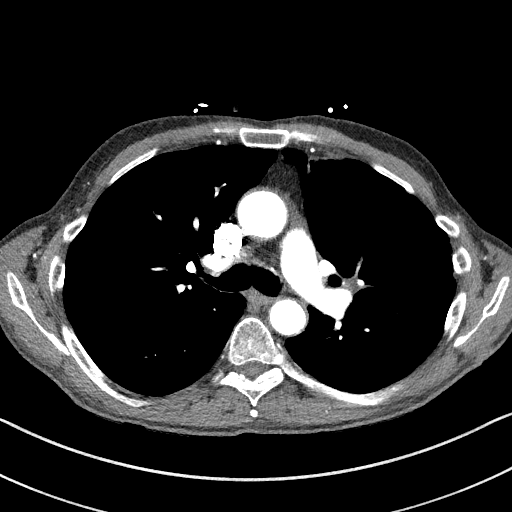
[im 215/231  soft-tissue]
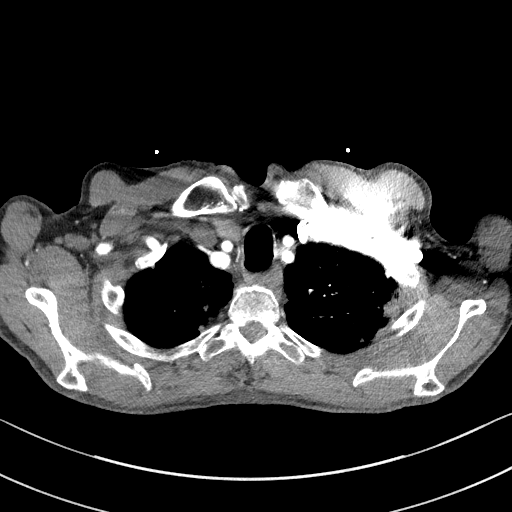
[im 215/231  bone]
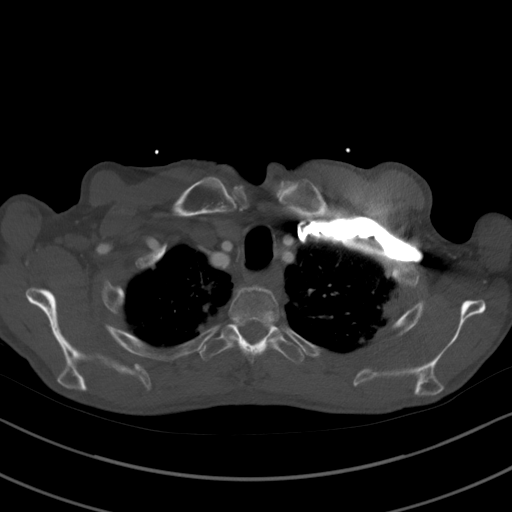

[Series 9: coronals · coronal · 0.73mm/px · 3 of 118 slices shown]
[im 30/118  soft-tissue]
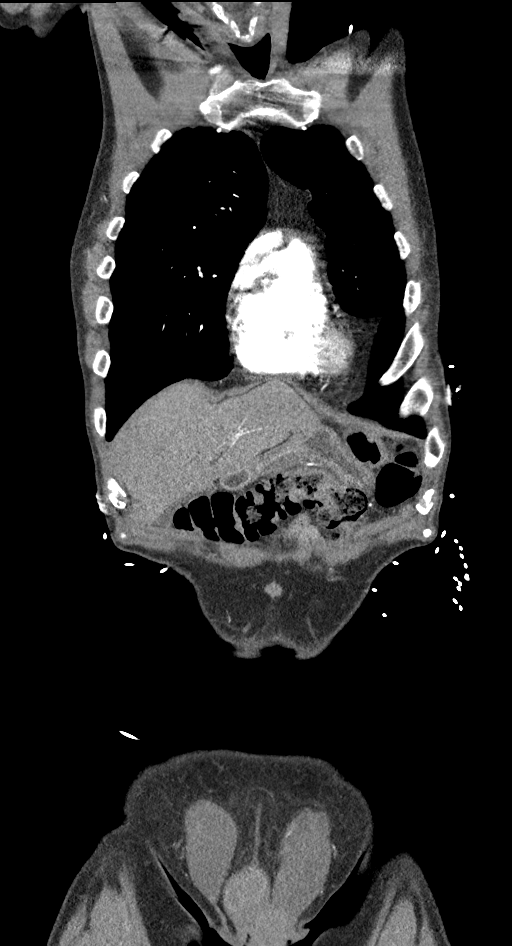
[im 59/118  soft-tissue]
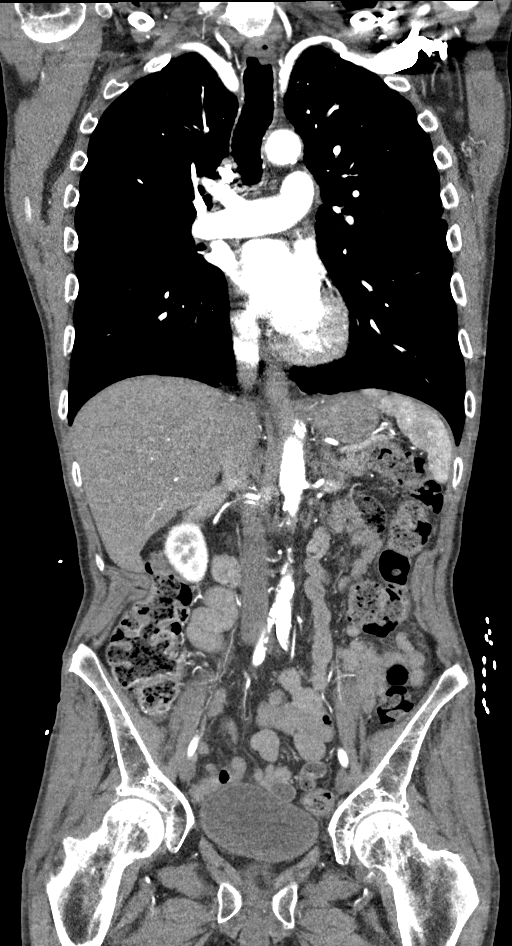
[im 88/118  soft-tissue]
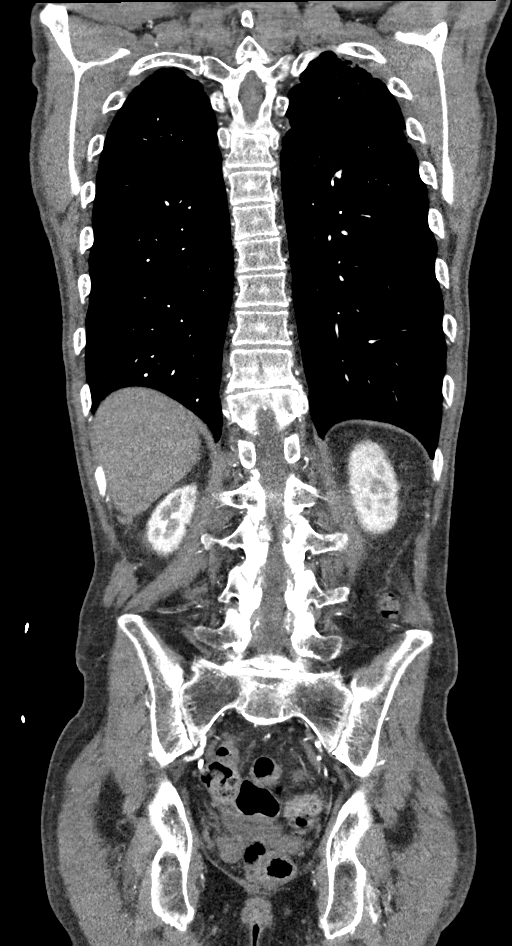

[12 of 46 positions shown; findings below may reference images not displayed]

FINDINGS: CTA CHEST FINDINGS

Cardiovascular: There is no evidence of aortic dissection. There is
no evidence of aneurysmal dilatation. Minimal calcification is seen
along the thoracic aorta, with minimal mural thrombus along the
descending thoracic aorta but no evidence of luminal narrowing.

There is no evidence of significant pulmonary embolus. The heart is
normal in size.

Mediastinum/Nodes: The mediastinum is unremarkable in appearance. No
mediastinal lymphadenopathy is seen. No pericardial effusion is
identified. The thyroid gland is unremarkable. No axillary
lymphadenopathy is seen.

Lungs/Pleura: Scarring is noted at the lung apices, with associated
blebs. Mild underlying emphysema is noted.

Mild focal airspace opacity at the right lung base is concerning for
mild pneumonia. No pleural effusion or pneumothorax is seen. No
dominant mass is identified.

Musculoskeletal: No acute osseous abnormalities are identified. The
visualized musculature is unremarkable in appearance.

Review of the MIP images confirms the above findings.

CTA ABDOMEN AND PELVIS FINDINGS

VASCULAR

Aorta: There is no evidence of aortic dissection. There is no
evidence of aneurysmal dilatation. Scattered calcification is seen
along the abdominal aorta and its branches.

Celiac: The celiac trunk appears patent, with mild calcification at
the origin of the celiac.

SMA: The superior mesenteric artery remains patent, with mild
calcification at the origin of the superior mesenteric artery.

Renals: Scattered calcification is noted at the proximal renal
arteries bilaterally, though they appear grossly patent. Two renal
arteries are seen on each side.

IMA: The inferior mesenteric artery remains grossly patent.

Inflow: Scattered calcification is noted along the common and
internal iliac arteries bilaterally. The common external iliac
arteries are grossly unremarkable. Calcification is noted along the
common femoral arteries bilaterally.

Veins: Visualized venous structures are grossly unremarkable.

Review of the MIP images confirms the above findings.

NON-VASCULAR

Hepatobiliary: The liver is unremarkable in appearance. A stone is
noted dependently within the gallbladder. The gallbladder is
otherwise grossly unremarkable. The common bile duct remains normal
in caliber.

Pancreas: The pancreas is within normal limits.

Spleen: The spleen is unremarkable in appearance.

Adrenals/Urinary Tract: Prominence of the adrenal glands raises
question for mild adrenal hyperplasia.

Mild nonspecific perinephric stranding is noted bilaterally. There
is no evidence of hydronephrosis. No renal or ureteral stones are
identified.

Stomach/Bowel: The stomach is unremarkable in appearance. The small
bowel is within normal limits. The patient is status post
appendectomy. The colon is unremarkable in appearance.

Lymphatic: No retroperitoneal or pelvic sidewall lymphadenopathy is
seen.

Reproductive: The bladder is moderately distended and grossly
unremarkable. The prostate is normal in size. The testes are seen at
the inguinal canals bilaterally

Other: No additional soft tissue abnormalities are seen.

Musculoskeletal: No acute osseous abnormalities are identified.
There is chronic compression deformity of vertebral body L2, with
associated changes of vertebroplasty. The visualized musculature is
unremarkable in appearance.

Review of the MIP images confirms the above findings.
IMPRESSION: 1. No evidence of aortic dissection. No evidence of aneurysmal
dilatation.
2. No evidence of significant pulmonary embolus.
3. Mild focal airspace opacity at the right lung base is concerning
for mild pneumonia.
4. Scarring at the lung apices, with associated blebs. Mild
underlying emphysema noted.
5. Cholelithiasis; gallbladder otherwise unremarkable in appearance.
6. Prominence of the adrenal glands raises question for mild adrenal
hyperplasia.

Aortic Atherosclerosis (GKBRQ-0H3.3) and Emphysema (GKBRQ-0DJ.9).

## 2020-04-09 ENCOUNTER — Other Ambulatory Visit: Payer: Self-pay | Admitting: Gerontology

## 2020-04-09 DIAGNOSIS — I739 Peripheral vascular disease, unspecified: Secondary | ICD-10-CM

## 2020-04-09 DIAGNOSIS — M79605 Pain in left leg: Secondary | ICD-10-CM

## 2020-04-09 DIAGNOSIS — M7989 Other specified soft tissue disorders: Secondary | ICD-10-CM

## 2020-04-09 DIAGNOSIS — M79604 Pain in right leg: Secondary | ICD-10-CM

## 2020-04-12 ENCOUNTER — Ambulatory Visit: Admission: RE | Admit: 2020-04-12 | Payer: PPO | Source: Ambulatory Visit

## 2020-04-13 ENCOUNTER — Ambulatory Visit: Payer: PPO

## 2020-04-15 ENCOUNTER — Encounter (INDEPENDENT_AMBULATORY_CARE_PROVIDER_SITE_OTHER): Payer: PPO | Admitting: Vascular Surgery

## 2020-04-16 ENCOUNTER — Ambulatory Visit: Payer: PPO

## 2020-04-19 ENCOUNTER — Ambulatory Visit: Payer: PPO

## 2020-05-24 ENCOUNTER — Ambulatory Visit
Admission: RE | Admit: 2020-05-24 | Discharge: 2020-05-24 | Disposition: A | Discharge status: Home / Self Care | Payer: PPO | Source: Ambulatory Visit | Attending: Gerontology | Admitting: Gerontology

## 2020-05-24 ENCOUNTER — Other Ambulatory Visit: Payer: Self-pay

## 2020-05-24 DIAGNOSIS — M7989 Other specified soft tissue disorders: Secondary | ICD-10-CM | POA: Diagnosis not present

## 2020-05-24 DIAGNOSIS — I739 Peripheral vascular disease, unspecified: Secondary | ICD-10-CM | POA: Diagnosis not present

## 2020-05-24 DIAGNOSIS — M79605 Pain in left leg: Secondary | ICD-10-CM | POA: Diagnosis not present

## 2020-05-24 DIAGNOSIS — R600 Localized edema: Secondary | ICD-10-CM | POA: Diagnosis not present

## 2020-05-24 DIAGNOSIS — M79604 Pain in right leg: Secondary | ICD-10-CM | POA: Diagnosis not present

## 2020-05-25 ENCOUNTER — Ambulatory Visit
Admission: RE | Admit: 2020-05-25 | Discharge: 2020-05-25 | Disposition: A | Discharge status: Home / Self Care | Payer: PPO | Source: Ambulatory Visit | Attending: Gerontology | Admitting: Gerontology

## 2020-05-25 DIAGNOSIS — I739 Peripheral vascular disease, unspecified: Secondary | ICD-10-CM | POA: Insufficient documentation

## 2020-07-09 DIAGNOSIS — R9089 Other abnormal findings on diagnostic imaging of central nervous system: Secondary | ICD-10-CM | POA: Diagnosis not present

## 2020-07-09 DIAGNOSIS — R0902 Hypoxemia: Secondary | ICD-10-CM | POA: Diagnosis not present

## 2020-07-09 DIAGNOSIS — R4781 Slurred speech: Secondary | ICD-10-CM | POA: Diagnosis not present

## 2020-07-09 DIAGNOSIS — E785 Hyperlipidemia, unspecified: Secondary | ICD-10-CM | POA: Diagnosis not present

## 2020-07-09 DIAGNOSIS — R0600 Dyspnea, unspecified: Secondary | ICD-10-CM | POA: Diagnosis not present

## 2020-07-09 DIAGNOSIS — R569 Unspecified convulsions: Secondary | ICD-10-CM | POA: Diagnosis not present

## 2020-07-09 DIAGNOSIS — F0390 Unspecified dementia without behavioral disturbance: Secondary | ICD-10-CM | POA: Diagnosis not present

## 2020-07-09 DIAGNOSIS — Z9181 History of falling: Secondary | ICD-10-CM | POA: Diagnosis not present

## 2020-07-09 DIAGNOSIS — G9389 Other specified disorders of brain: Secondary | ICD-10-CM | POA: Diagnosis not present

## 2020-07-09 DIAGNOSIS — R531 Weakness: Secondary | ICD-10-CM | POA: Diagnosis not present

## 2020-07-09 DIAGNOSIS — Z792 Long term (current) use of antibiotics: Secondary | ICD-10-CM | POA: Diagnosis not present

## 2020-07-09 DIAGNOSIS — Z79899 Other long term (current) drug therapy: Secondary | ICD-10-CM | POA: Diagnosis not present

## 2020-07-09 DIAGNOSIS — J441 Chronic obstructive pulmonary disease with (acute) exacerbation: Secondary | ICD-10-CM | POA: Diagnosis not present

## 2020-07-09 DIAGNOSIS — R000 Tachycardia, unspecified: Secondary | ICD-10-CM | POA: Diagnosis not present

## 2020-07-09 DIAGNOSIS — G253 Myoclonus: Secondary | ICD-10-CM | POA: Diagnosis not present

## 2020-07-09 DIAGNOSIS — R3121 Asymptomatic microscopic hematuria: Secondary | ICD-10-CM | POA: Diagnosis not present

## 2020-07-09 DIAGNOSIS — R404 Transient alteration of awareness: Secondary | ICD-10-CM | POA: Diagnosis not present

## 2020-07-09 DIAGNOSIS — R29810 Facial weakness: Secondary | ICD-10-CM | POA: Diagnosis not present

## 2020-07-09 DIAGNOSIS — R410 Disorientation, unspecified: Secondary | ICD-10-CM | POA: Diagnosis not present

## 2020-07-09 DIAGNOSIS — N390 Urinary tract infection, site not specified: Secondary | ICD-10-CM | POA: Diagnosis not present

## 2020-07-09 DIAGNOSIS — E1142 Type 2 diabetes mellitus with diabetic polyneuropathy: Secondary | ICD-10-CM | POA: Diagnosis not present

## 2020-07-09 DIAGNOSIS — K8020 Calculus of gallbladder without cholecystitis without obstruction: Secondary | ICD-10-CM | POA: Diagnosis not present

## 2020-07-09 DIAGNOSIS — R911 Solitary pulmonary nodule: Secondary | ICD-10-CM | POA: Diagnosis not present

## 2020-07-09 DIAGNOSIS — Z20822 Contact with and (suspected) exposure to covid-19: Secondary | ICD-10-CM | POA: Diagnosis not present

## 2020-07-09 DIAGNOSIS — F329 Major depressive disorder, single episode, unspecified: Secondary | ICD-10-CM | POA: Diagnosis not present

## 2020-07-09 DIAGNOSIS — R262 Difficulty in walking, not elsewhere classified: Secondary | ICD-10-CM | POA: Diagnosis not present

## 2020-07-09 DIAGNOSIS — E1151 Type 2 diabetes mellitus with diabetic peripheral angiopathy without gangrene: Secondary | ICD-10-CM | POA: Diagnosis not present

## 2020-07-09 DIAGNOSIS — R200 Anesthesia of skin: Secondary | ICD-10-CM | POA: Diagnosis not present

## 2020-07-09 DIAGNOSIS — G9340 Encephalopathy, unspecified: Secondary | ICD-10-CM | POA: Diagnosis not present

## 2020-07-09 DIAGNOSIS — R918 Other nonspecific abnormal finding of lung field: Secondary | ICD-10-CM | POA: Diagnosis not present

## 2020-07-09 DIAGNOSIS — I6529 Occlusion and stenosis of unspecified carotid artery: Secondary | ICD-10-CM | POA: Diagnosis not present

## 2020-07-09 DIAGNOSIS — I10 Essential (primary) hypertension: Secondary | ICD-10-CM | POA: Diagnosis not present

## 2020-07-09 DIAGNOSIS — R32 Unspecified urinary incontinence: Secondary | ICD-10-CM | POA: Diagnosis not present

## 2020-07-09 DIAGNOSIS — Z7902 Long term (current) use of antithrombotics/antiplatelets: Secondary | ICD-10-CM | POA: Diagnosis not present

## 2020-07-10 DIAGNOSIS — J441 Chronic obstructive pulmonary disease with (acute) exacerbation: Secondary | ICD-10-CM | POA: Diagnosis not present

## 2020-07-10 DIAGNOSIS — R410 Disorientation, unspecified: Secondary | ICD-10-CM | POA: Diagnosis not present

## 2020-07-10 DIAGNOSIS — R531 Weakness: Secondary | ICD-10-CM | POA: Diagnosis not present

## 2020-07-10 DIAGNOSIS — R0789 Other chest pain: Secondary | ICD-10-CM | POA: Diagnosis not present

## 2020-07-24 DIAGNOSIS — K219 Gastro-esophageal reflux disease without esophagitis: Secondary | ICD-10-CM | POA: Diagnosis not present

## 2020-07-24 DIAGNOSIS — F329 Major depressive disorder, single episode, unspecified: Secondary | ICD-10-CM | POA: Diagnosis not present

## 2020-07-24 DIAGNOSIS — E785 Hyperlipidemia, unspecified: Secondary | ICD-10-CM | POA: Diagnosis not present

## 2020-07-24 DIAGNOSIS — I739 Peripheral vascular disease, unspecified: Secondary | ICD-10-CM | POA: Diagnosis not present

## 2020-07-24 DIAGNOSIS — I7300 Raynaud's syndrome without gangrene: Secondary | ICD-10-CM | POA: Diagnosis not present

## 2020-07-24 DIAGNOSIS — K5900 Constipation, unspecified: Secondary | ICD-10-CM | POA: Diagnosis not present

## 2020-07-24 DIAGNOSIS — R29898 Other symptoms and signs involving the musculoskeletal system: Secondary | ICD-10-CM | POA: Diagnosis not present

## 2020-07-24 DIAGNOSIS — R079 Chest pain, unspecified: Secondary | ICD-10-CM | POA: Diagnosis not present

## 2020-07-24 DIAGNOSIS — N400 Enlarged prostate without lower urinary tract symptoms: Secondary | ICD-10-CM | POA: Diagnosis not present

## 2020-07-24 DIAGNOSIS — I10 Essential (primary) hypertension: Secondary | ICD-10-CM | POA: Diagnosis not present

## 2020-07-24 DIAGNOSIS — F0390 Unspecified dementia without behavioral disturbance: Secondary | ICD-10-CM | POA: Diagnosis not present

## 2020-07-24 DIAGNOSIS — J441 Chronic obstructive pulmonary disease with (acute) exacerbation: Secondary | ICD-10-CM | POA: Diagnosis not present

## 2020-07-24 DIAGNOSIS — G629 Polyneuropathy, unspecified: Secondary | ICD-10-CM | POA: Diagnosis not present

## 2020-08-02 DIAGNOSIS — R410 Disorientation, unspecified: Secondary | ICD-10-CM | POA: Diagnosis not present

## 2020-08-02 DIAGNOSIS — R911 Solitary pulmonary nodule: Secondary | ICD-10-CM | POA: Diagnosis not present

## 2020-08-02 DIAGNOSIS — R29898 Other symptoms and signs involving the musculoskeletal system: Secondary | ICD-10-CM | POA: Diagnosis not present

## 2020-08-02 DIAGNOSIS — J441 Chronic obstructive pulmonary disease with (acute) exacerbation: Secondary | ICD-10-CM | POA: Diagnosis not present

## 2020-08-02 DIAGNOSIS — E538 Deficiency of other specified B group vitamins: Secondary | ICD-10-CM | POA: Diagnosis not present

## 2020-08-04 DIAGNOSIS — R29898 Other symptoms and signs involving the musculoskeletal system: Secondary | ICD-10-CM | POA: Diagnosis not present

## 2020-08-04 DIAGNOSIS — E785 Hyperlipidemia, unspecified: Secondary | ICD-10-CM | POA: Diagnosis not present

## 2020-08-04 DIAGNOSIS — I739 Peripheral vascular disease, unspecified: Secondary | ICD-10-CM | POA: Diagnosis not present

## 2020-08-04 DIAGNOSIS — G629 Polyneuropathy, unspecified: Secondary | ICD-10-CM | POA: Diagnosis not present

## 2020-08-04 DIAGNOSIS — F329 Major depressive disorder, single episode, unspecified: Secondary | ICD-10-CM | POA: Diagnosis not present

## 2020-08-04 DIAGNOSIS — F0390 Unspecified dementia without behavioral disturbance: Secondary | ICD-10-CM | POA: Diagnosis not present

## 2020-08-04 DIAGNOSIS — I10 Essential (primary) hypertension: Secondary | ICD-10-CM | POA: Diagnosis not present

## 2020-08-04 DIAGNOSIS — J441 Chronic obstructive pulmonary disease with (acute) exacerbation: Secondary | ICD-10-CM | POA: Diagnosis not present

## 2020-08-04 DIAGNOSIS — K219 Gastro-esophageal reflux disease without esophagitis: Secondary | ICD-10-CM | POA: Diagnosis not present

## 2020-08-04 DIAGNOSIS — N400 Enlarged prostate without lower urinary tract symptoms: Secondary | ICD-10-CM | POA: Diagnosis not present

## 2020-08-04 DIAGNOSIS — I7300 Raynaud's syndrome without gangrene: Secondary | ICD-10-CM | POA: Diagnosis not present

## 2020-08-04 DIAGNOSIS — R079 Chest pain, unspecified: Secondary | ICD-10-CM | POA: Diagnosis not present

## 2020-08-04 DIAGNOSIS — K5900 Constipation, unspecified: Secondary | ICD-10-CM | POA: Diagnosis not present

## 2020-08-06 ENCOUNTER — Other Ambulatory Visit: Payer: Self-pay | Admitting: Family Medicine

## 2020-08-06 DIAGNOSIS — J441 Chronic obstructive pulmonary disease with (acute) exacerbation: Secondary | ICD-10-CM

## 2020-08-06 DIAGNOSIS — R911 Solitary pulmonary nodule: Secondary | ICD-10-CM

## 2020-08-17 ENCOUNTER — Ambulatory Visit: Payer: PPO

## 2020-09-03 DIAGNOSIS — F0390 Unspecified dementia without behavioral disturbance: Secondary | ICD-10-CM | POA: Diagnosis not present

## 2020-09-03 DIAGNOSIS — G629 Polyneuropathy, unspecified: Secondary | ICD-10-CM | POA: Diagnosis not present

## 2020-09-03 DIAGNOSIS — K219 Gastro-esophageal reflux disease without esophagitis: Secondary | ICD-10-CM | POA: Diagnosis not present

## 2020-09-03 DIAGNOSIS — I739 Peripheral vascular disease, unspecified: Secondary | ICD-10-CM | POA: Diagnosis not present

## 2020-09-03 DIAGNOSIS — F329 Major depressive disorder, single episode, unspecified: Secondary | ICD-10-CM | POA: Diagnosis not present

## 2020-09-03 DIAGNOSIS — I10 Essential (primary) hypertension: Secondary | ICD-10-CM | POA: Diagnosis not present

## 2020-09-03 DIAGNOSIS — E785 Hyperlipidemia, unspecified: Secondary | ICD-10-CM | POA: Diagnosis not present

## 2020-09-03 DIAGNOSIS — I7300 Raynaud's syndrome without gangrene: Secondary | ICD-10-CM | POA: Diagnosis not present

## 2020-09-03 DIAGNOSIS — N400 Enlarged prostate without lower urinary tract symptoms: Secondary | ICD-10-CM | POA: Diagnosis not present

## 2020-09-03 DIAGNOSIS — J441 Chronic obstructive pulmonary disease with (acute) exacerbation: Secondary | ICD-10-CM | POA: Diagnosis not present

## 2020-09-03 DIAGNOSIS — K5900 Constipation, unspecified: Secondary | ICD-10-CM | POA: Diagnosis not present

## 2020-09-03 DIAGNOSIS — R29898 Other symptoms and signs involving the musculoskeletal system: Secondary | ICD-10-CM | POA: Diagnosis not present

## 2020-09-03 DIAGNOSIS — R079 Chest pain, unspecified: Secondary | ICD-10-CM | POA: Diagnosis not present

## 2020-09-08 DIAGNOSIS — I10 Essential (primary) hypertension: Secondary | ICD-10-CM | POA: Diagnosis not present

## 2020-09-08 DIAGNOSIS — F3341 Major depressive disorder, recurrent, in partial remission: Secondary | ICD-10-CM | POA: Diagnosis not present

## 2020-09-08 DIAGNOSIS — E7800 Pure hypercholesterolemia, unspecified: Secondary | ICD-10-CM | POA: Diagnosis not present

## 2020-09-08 DIAGNOSIS — N401 Enlarged prostate with lower urinary tract symptoms: Secondary | ICD-10-CM | POA: Diagnosis not present

## 2020-09-08 DIAGNOSIS — R7302 Impaired glucose tolerance (oral): Secondary | ICD-10-CM | POA: Diagnosis not present

## 2020-09-08 DIAGNOSIS — I7300 Raynaud's syndrome without gangrene: Secondary | ICD-10-CM | POA: Diagnosis not present

## 2020-09-08 DIAGNOSIS — Z8781 Personal history of (healed) traumatic fracture: Secondary | ICD-10-CM | POA: Diagnosis not present

## 2020-09-08 DIAGNOSIS — J418 Mixed simple and mucopurulent chronic bronchitis: Secondary | ICD-10-CM | POA: Diagnosis not present

## 2020-09-08 DIAGNOSIS — G40909 Epilepsy, unspecified, not intractable, without status epilepticus: Secondary | ICD-10-CM | POA: Diagnosis not present

## 2020-09-08 DIAGNOSIS — R350 Frequency of micturition: Secondary | ICD-10-CM | POA: Diagnosis not present

## 2021-02-10 DIAGNOSIS — I951 Orthostatic hypotension: Secondary | ICD-10-CM | POA: Diagnosis not present

## 2021-02-10 DIAGNOSIS — W19XXXA Unspecified fall, initial encounter: Secondary | ICD-10-CM | POA: Diagnosis not present

## 2021-02-10 DIAGNOSIS — R009 Unspecified abnormalities of heart beat: Secondary | ICD-10-CM | POA: Diagnosis not present

## 2021-02-10 DIAGNOSIS — S51012A Laceration without foreign body of left elbow, initial encounter: Secondary | ICD-10-CM | POA: Diagnosis not present

## 2021-02-10 DIAGNOSIS — R5381 Other malaise: Secondary | ICD-10-CM | POA: Diagnosis not present

## 2021-02-10 DIAGNOSIS — R296 Repeated falls: Secondary | ICD-10-CM | POA: Diagnosis not present

## 2021-02-10 DIAGNOSIS — S41112A Laceration without foreign body of left upper arm, initial encounter: Secondary | ICD-10-CM | POA: Diagnosis not present

## 2021-02-11 ENCOUNTER — Emergency Department
Admission: EM | Admit: 2021-02-11 | Discharge: 2021-02-15 | Disposition: A | Discharge status: Home / Self Care | Payer: PPO | Attending: Emergency Medicine | Admitting: Emergency Medicine

## 2021-02-11 ENCOUNTER — Emergency Department: Payer: PPO

## 2021-02-11 ENCOUNTER — Other Ambulatory Visit: Payer: Self-pay

## 2021-02-11 DIAGNOSIS — F0390 Unspecified dementia without behavioral disturbance: Secondary | ICD-10-CM | POA: Insufficient documentation

## 2021-02-11 DIAGNOSIS — M3320 Polymyositis, organ involvement unspecified: Secondary | ICD-10-CM | POA: Diagnosis not present

## 2021-02-11 DIAGNOSIS — R0902 Hypoxemia: Secondary | ICD-10-CM | POA: Diagnosis not present

## 2021-02-11 DIAGNOSIS — Z20822 Contact with and (suspected) exposure to covid-19: Secondary | ICD-10-CM | POA: Insufficient documentation

## 2021-02-11 DIAGNOSIS — G9389 Other specified disorders of brain: Secondary | ICD-10-CM | POA: Diagnosis not present

## 2021-02-11 DIAGNOSIS — I6529 Occlusion and stenosis of unspecified carotid artery: Secondary | ICD-10-CM | POA: Diagnosis not present

## 2021-02-11 DIAGNOSIS — J439 Emphysema, unspecified: Secondary | ICD-10-CM | POA: Diagnosis not present

## 2021-02-11 DIAGNOSIS — M609 Myositis, unspecified: Secondary | ICD-10-CM | POA: Diagnosis not present

## 2021-02-11 DIAGNOSIS — F17200 Nicotine dependence, unspecified, uncomplicated: Secondary | ICD-10-CM | POA: Insufficient documentation

## 2021-02-11 DIAGNOSIS — R531 Weakness: Secondary | ICD-10-CM | POA: Diagnosis not present

## 2021-02-11 DIAGNOSIS — R102 Pelvic and perineal pain: Secondary | ICD-10-CM | POA: Diagnosis not present

## 2021-02-11 DIAGNOSIS — M4856XA Collapsed vertebra, not elsewhere classified, lumbar region, initial encounter for fracture: Secondary | ICD-10-CM | POA: Diagnosis not present

## 2021-02-11 DIAGNOSIS — R2681 Unsteadiness on feet: Secondary | ICD-10-CM | POA: Diagnosis not present

## 2021-02-11 DIAGNOSIS — S41112A Laceration without foreign body of left upper arm, initial encounter: Secondary | ICD-10-CM | POA: Diagnosis not present

## 2021-02-11 DIAGNOSIS — S0990XA Unspecified injury of head, initial encounter: Secondary | ICD-10-CM | POA: Diagnosis not present

## 2021-02-11 DIAGNOSIS — R410 Disorientation, unspecified: Secondary | ICD-10-CM | POA: Diagnosis not present

## 2021-02-11 DIAGNOSIS — R9082 White matter disease, unspecified: Secondary | ICD-10-CM | POA: Diagnosis not present

## 2021-02-11 DIAGNOSIS — S51012A Laceration without foreign body of left elbow, initial encounter: Secondary | ICD-10-CM | POA: Diagnosis not present

## 2021-02-11 DIAGNOSIS — M47812 Spondylosis without myelopathy or radiculopathy, cervical region: Secondary | ICD-10-CM | POA: Diagnosis not present

## 2021-02-11 DIAGNOSIS — W19XXXA Unspecified fall, initial encounter: Secondary | ICD-10-CM | POA: Diagnosis not present

## 2021-02-11 DIAGNOSIS — M5134 Other intervertebral disc degeneration, thoracic region: Secondary | ICD-10-CM | POA: Diagnosis not present

## 2021-02-11 DIAGNOSIS — S199XXA Unspecified injury of neck, initial encounter: Secondary | ICD-10-CM | POA: Diagnosis not present

## 2021-02-11 DIAGNOSIS — I10 Essential (primary) hypertension: Secondary | ICD-10-CM | POA: Diagnosis not present

## 2021-02-11 DIAGNOSIS — R296 Repeated falls: Secondary | ICD-10-CM | POA: Diagnosis not present

## 2021-02-11 DIAGNOSIS — J449 Chronic obstructive pulmonary disease, unspecified: Secondary | ICD-10-CM | POA: Insufficient documentation

## 2021-02-11 DIAGNOSIS — S4992XA Unspecified injury of left shoulder and upper arm, initial encounter: Secondary | ICD-10-CM | POA: Diagnosis present

## 2021-02-11 LAB — COMPREHENSIVE METABOLIC PANEL
ALT: 20 U/L (ref 0–44)
AST: 24 U/L (ref 15–41)
Albumin: 3.8 g/dL (ref 3.5–5.0)
Alkaline Phosphatase: 96 U/L (ref 38–126)
Anion gap: 9 (ref 5–15)
BUN: 15 mg/dL (ref 8–23)
CO2: 29 mmol/L (ref 22–32)
Calcium: 9 mg/dL (ref 8.9–10.3)
Chloride: 102 mmol/L (ref 98–111)
Creatinine, Ser: 0.99 mg/dL (ref 0.61–1.24)
GFR, Estimated: >60 mL/min (ref >60)
Glucose, Bld: 87 mg/dL (ref 70–99)
Potassium: 4.1 mmol/L (ref 3.5–5.1)
Sodium: 140 mmol/L (ref 135–145)
Total Bilirubin: 0.5 mg/dL (ref 0.3–1.2)
Total Protein: 7.5 g/dL (ref 6.5–8.1)

## 2021-02-11 LAB — CBC WITH DIFFERENTIAL/PLATELET
Abs Immature Granulocytes: 0.02 10*3/uL (ref 0.00–0.07)
Basophils Absolute: 0 10*3/uL (ref 0.0–0.1)
Basophils Relative: 0 %
Eosinophils Absolute: 0.2 10*3/uL (ref 0.0–0.5)
Eosinophils Relative: 3 %
HCT: 39.4 % (ref 39.0–52.0)
Hemoglobin: 13.1 g/dL (ref 13.0–17.0)
Immature Granulocytes: 0 %
Lymphocytes Relative: 11 %
Lymphs Abs: 0.8 10*3/uL (ref 0.7–4.0)
MCH: 30 pg (ref 26.0–34.0)
MCHC: 33.2 g/dL (ref 30.0–36.0)
MCV: 90.4 fL (ref 80.0–100.0)
Monocytes Absolute: 1 10*3/uL (ref 0.1–1.0)
Monocytes Relative: 13 %
Neutro Abs: 5.6 10*3/uL (ref 1.7–7.7)
Neutrophils Relative %: 73 %
Platelets: 249 10*3/uL (ref 150–400)
RBC: 4.36 MIL/uL (ref 4.22–5.81)
RDW: 13.9 % (ref 11.5–15.5)
WBC: 7.6 10*3/uL (ref 4.0–10.5)
nRBC: 0 % (ref 0.0–0.2)

## 2021-02-11 LAB — URINALYSIS, COMPLETE (UACMP) WITH MICROSCOPIC
Bacteria, UA: NONE SEEN
Bilirubin Urine: NEGATIVE
Glucose, UA: NEGATIVE mg/dL
Hgb urine dipstick: NEGATIVE
Ketones, ur: NEGATIVE mg/dL
Leukocytes,Ua: NEGATIVE
Nitrite: NEGATIVE
Protein, ur: NEGATIVE mg/dL
Specific Gravity, Urine: 1.016 (ref 1.005–1.030)
pH: 7 (ref 5.0–8.0)

## 2021-02-11 LAB — RESP PANEL BY RT-PCR (FLU A&B, COVID) ARPGX2
Influenza A by PCR: NEGATIVE
Influenza B by PCR: NEGATIVE
SARS Coronavirus 2 by RT PCR: NEGATIVE

## 2021-02-11 LAB — TROPONIN I (HIGH SENSITIVITY)
Troponin I (High Sensitivity): 15 ng/L (ref <18)
Troponin I (High Sensitivity): 16 ng/L (ref <18)

## 2021-02-11 LAB — TSH: TSH: 1.156 u[IU]/mL (ref 0.350–4.500)

## 2021-02-11 MED ORDER — AMITRIPTYLINE HCL 50 MG PO TABS
150.0000 mg | ORAL_TABLET | Freq: Two times a day (BID) | ORAL | Status: DC
  Administered 2021-02-11: 150 mg via ORAL
  Filled 2021-02-11: qty 3

## 2021-02-11 MED ORDER — ACETAMINOPHEN 500 MG PO TABS
1000.0000 mg | ORAL_TABLET | Freq: Once | ORAL | Status: AC
  Administered 2021-02-11: 1000 mg via ORAL
  Filled 2021-02-11: qty 2

## 2021-02-11 MED ORDER — ADULT MULTIVITAMIN W/MINERALS CH
1.0000 | ORAL_TABLET | Freq: Every day | ORAL | Status: DC
  Administered 2021-02-11 – 2021-02-15 (×5): 1 via ORAL
  Filled 2021-02-11 (×5): qty 1

## 2021-02-11 MED ORDER — PHENYTOIN SODIUM EXTENDED 100 MG PO CAPS
100.0000 mg | ORAL_CAPSULE | Freq: Three times a day (TID) | ORAL | Status: DC
  Administered 2021-02-11 – 2021-02-15 (×13): 100 mg via ORAL
  Filled 2021-02-11 (×13): qty 1

## 2021-02-11 MED ORDER — GABAPENTIN 100 MG PO CAPS
100.0000 mg | ORAL_CAPSULE | Freq: Two times a day (BID) | ORAL | Status: DC
  Administered 2021-02-11 – 2021-02-15 (×9): 100 mg via ORAL
  Filled 2021-02-11 (×9): qty 1

## 2021-02-11 MED ORDER — CLOPIDOGREL BISULFATE 75 MG PO TABS
75.0000 mg | ORAL_TABLET | Freq: Every day | ORAL | Status: DC
  Administered 2021-02-11 – 2021-02-15 (×5): 75 mg via ORAL
  Filled 2021-02-11 (×5): qty 1

## 2021-02-11 MED ORDER — DONEPEZIL HCL 5 MG PO TABS
5.0000 mg | ORAL_TABLET | Freq: Every day | ORAL | Status: DC
  Administered 2021-02-11 – 2021-02-14 (×4): 5 mg via ORAL
  Filled 2021-02-11 (×4): qty 1

## 2021-02-11 MED ORDER — SERTRALINE HCL 50 MG PO TABS
50.0000 mg | ORAL_TABLET | Freq: Every day | ORAL | Status: DC
  Administered 2021-02-11 – 2021-02-14 (×4): 50 mg via ORAL
  Filled 2021-02-11 (×4): qty 1

## 2021-02-11 MED ORDER — ALBUTEROL SULFATE HFA 108 (90 BASE) MCG/ACT IN AERS
2.0000 | INHALATION_SPRAY | Freq: Four times a day (QID) | RESPIRATORY_TRACT | Status: DC | PRN
  Administered 2021-02-13: 2 via RESPIRATORY_TRACT
  Filled 2021-02-11 (×2): qty 6.7

## 2021-02-11 MED ORDER — ACETAMINOPHEN 325 MG PO TABS: 325.0000 mg | ORAL_TABLET | Freq: Four times a day (QID) | ORAL | Status: DC | PRN

## 2021-02-11 MED ORDER — TAMSULOSIN HCL 0.4 MG PO CAPS
0.4000 mg | ORAL_CAPSULE | Freq: Every day | ORAL | Status: DC
  Administered 2021-02-11 – 2021-02-15 (×5): 0.4 mg via ORAL
  Filled 2021-02-11 (×5): qty 1

## 2021-02-11 NOTE — Evaluation (Signed)
Occupational Therapy Evaluation Patient Details Name: Jamie Parsons. MRN: [id number] DOB: 20-Jan-1941 Today's Date: 02/11/2021    History of Present Illness Pt is a pleasant 80 y.o. male with a past medical history of COPD, HTN, HDL, chronic tobacco use, seizure disorder, CVA and dementia, not oriented at baseline per EMS who presents via EMS from home for assessment of several falls over the last couple of days. He endorses some mild pain in his left upper arm but no other acute pain.  He is not sure if he has been sick with any other acute sick symptoms lately. Imaging negative for acute fracture aside from elbow imaging indicating "tiny osseous fragment at the ventral margin of the ulnar trochlea, which could represent a tiny avulsion fragment."   Clinical Impression   Pt was seen for OT evaluation this date. Pt pleasant, alert and oriented, agreeable to OT. Prior to hospital admission, and per pt report (no family/caregiver present to verify), pt was ambulating with a 4WW, modified independent with seated shower, dressing. Pt lives in a handicap accessible apartment with his spouse. They work on medication management together with a pill box. Pt reports they pay a neighbor to provide transportation, help with groceries, cleaning, and some meal prep. Per pt the neighbor is the woman who brought him to the ED. Pt endorses multiple falls, 2/2 getting "swimmy headed and losing my balance." Currently pt demonstrates impairments in balance, LLE strength, bilat foot sensory impairments (dark purple and cold to touch, RN notified), and questionable historian as described below (See OT problem list) which functionally limit his ability to perform ADL/self-care tasks. Pt follows commands well. Pt currently required CGA in sitting to don socks at the edge of the stretcher. MIN A to STS with handheld assist, with noted posterior lean. Pt endorsed "swimmy headed" feeling with sitting and worsening with  standing. BP attempted (see below for detail) and RN notified of drop with positional changes. Pt unable to tolerate standing long enough to get BP. Pt educated in falls prevention strategies, benefits of additional skilled OT services. Pt verbalized understanding and in agreement. Pt would benefit from skilled OT services to address noted impairments and functional limitations (see below for any additional details) in order to maximize safety and independence while minimizing falls risk and caregiver burden. Upon hospital discharge, recommend STR to maximize pt safety and return to PLOF. Will continue to assess for appropriateness of HHOT instead with 24/7 sup/assist pending additional progress with therapy.      Follow Up Recommendations  SNF    Equipment Recommendations  None recommended by OT    Recommendations for Other Services       Precautions / Restrictions Precautions Precautions: Fall Precaution Comments: monitor BP/orthostatocs Restrictions Weight Bearing Restrictions: No      Mobility Bed Mobility Overal bed mobility: Needs Assistance Bed Mobility: Supine to Sit;Sit to Supine     Supine to sit: Min guard;HOB elevated Sit to supine: Min guard;HOB elevated   General bed mobility comments: increased use of BUE to help    Transfers Overall transfer level: Needs assistance Equipment used: 1 person hand held assist Transfers: Sit to/from Stand Sit to Stand: Min assist;From elevated surface              Balance Overall balance assessment: Needs assistance Sitting-balance support: No upper extremity supported;Feet supported Sitting balance-Leahy Scale: Good   Postural control: Posterior lean Standing balance support: Bilateral upper extremity supported Standing balance-Leahy Scale: Poor Standing balance comment:  requires assist to maintain static standing balanceu unable to let go with UE, wobbly, slight posterior lean                           ADL  either performed or assessed with clinical judgement   ADL Overall ADL's : Needs assistance/impaired                                       General ADL Comments: MIN A for ADL transfers, CGA and set up for donning socks EOB/stretcher, supervision for seated ADL for safety/balance     Vision Baseline Vision/History:  (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) Patient Visual Report: No change from baseline Vision Assessment?: No apparent visual deficits     Perception     Praxis      Pertinent Vitals/Pain Pain Assessment: No/denies pain     Hand Dominance Right   Extremity/Trunk Assessment Upper Extremity Assessment Upper Extremity Assessment: Overall WFL for tasks assessed (grossly 5/5 bilaterally)   Lower Extremity Assessment Lower Extremity Assessment: LLE deficits/detail;RLE deficits/detail RLE Deficits / Details: at least 4+/5; foot dark purple and cold to touch, decreased sensation (pt reports "it usually looks worse than that!") LLE Deficits / Details: at least 4/5; foot dark purple and cold to touch, decreased sensation (pt reports "it usually looks worse than that!") LLE Coordination: decreased fine motor   Cervical / Trunk Assessment Cervical / Trunk Assessment: Normal   Communication Communication Communication: No difficulties   Cognition Arousal/Alertness: Awake/alert Behavior During Therapy: WFL for tasks assessed/performed Overall Cognitive Status: Within Functional Limits for tasks assessed                                 General Comments: alert and oriented, follows commands well, questionable memory   General Comments  supine/long sitting BP 150/84, sitting EOB 147/85 and endorses being a little "swimmy headed", pt unable to tolerate standing long enough to get standing BP, once sitting BP 134/109 - RN notified    Exercises Other Exercises Other Exercises: Pt educated in falls prevention and strategies to minimize or  manage dizziness with positional changes   Shoulder Instructions      Home Living Family/patient expects to be discharged to:: Private residence Living Arrangements: Spouse/significant other (spouse not able to provide much physical assist) Available Help at Discharge: Family;Neighbor Type of Home: Apartment (handicap apt) Home Access: Level entry     Home Layout: One level     Bathroom Shower/Tub: Chief Strategy Officer: Handicapped height Bathroom Accessibility: Yes   Home Equipment: Environmental consultant - 4 wheels;Tub bench   Additional Comments: Pt documented to be poor historian, but A&Ox4 on 02/11/21, reports having a 4WW and a tub transfer bench, no other equipment      Prior Functioning/Environment Level of Independence: Needs assistance  Gait / Transfers Assistance Needed: Pt reports using 4WW for mobility but endorses sometimes leaving it behind ADL's / Homemaking Assistance Needed: Pt reports indep with seated shower, dressing, and he and his wife do medications together using a pill box; pt reports they pay a neighbor to provide transportation, help with groceries, cleaning, and some meal prep   Comments: Pt endorses multiple falls due to "getting swimmy headed and losing my balance"  OT Problem List: Decreased strength;Decreased coordination;Cardiopulmonary status limiting activity;Impaired sensation;Decreased safety awareness;Decreased activity tolerance;Impaired balance (sitting and/or standing);Decreased knowledge of use of DME or AE;Impaired vision/perception      OT Treatment/Interventions: Self-care/ADL training;Therapeutic exercise;Therapeutic activities;DME and/or AE instruction;Patient/family education;Balance training;Energy conservation    OT Goals(Current goals can be found in the care plan section) Acute Rehab OT Goals Patient Stated Goal: go home with my wife OT Goal Formulation: With patient Time For Goal Achievement: 02/25/21 Potential to  Achieve Goals: Good ADL Goals Pt Will Perform Lower Body Dressing: sit to/from stand;with supervision Pt Will Transfer to Toilet: with supervision;ambulating (LRAD for amb, elevated commode) Additional ADL Goal #1: Pt will verbalize plan to implement at least 1 learned falls prevention strategy  OT Frequency: Min 2X/week   Barriers to D/C:            Co-evaluation              AM-PAC OT "6 Clicks" Daily Activity     Outcome Measure Help from another person eating meals?: None Help from another person taking care of personal grooming?: A Little Help from another person toileting, which includes using toliet, bedpan, or urinal?: A Lot Help from another person bathing (including washing, rinsing, drying)?: A Little Help from another person to put on and taking off regular upper body clothing?: A Little Help from another person to put on and taking off regular lower body clothing?: A Little 6 Click Score: 18   End of Session Equipment Utilized During Treatment: Gait belt Nurse Communication: Mobility status;Other (comment) (BP, dizziness)  Activity Tolerance: Patient tolerated treatment well Patient left: in bed;with call bell/phone within reach;with nursing/sitter in room (on stretcher in hall of ED)  OT Visit Diagnosis: Other abnormalities of gait and mobility (R26.89);Repeated falls (R29.6);Muscle weakness (generalized) (M62.81)                Time: 8127-5170 OT Time Calculation (min): 40 min Charges:  OT General Charges $OT Visit: 1 Visit OT Evaluation $OT Eval Moderate Complexity: 1 Mod OT Treatments $Therapeutic Activity: 8-22 mins  Wynona Canes, MPH, MS, OTR/L ascom [phone] 02/11/21, 5:04 PM

## 2021-02-11 NOTE — ED Triage Notes (Signed)
Patient arrived by EMS due to multiple falls during the last few days according to spouse. Pt fell yesterday and has a skin tear on left arm. Wife stated to EMS that pt has vascular dementia. EMS states pt was weak on their arrival.
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
## 2021-02-11 NOTE — ED Notes (Signed)
Patient back from CT scan and Xray at this time.

## 2021-02-11 NOTE — NC FL2 (Signed)
[place] MEDICAID FL2 LEVEL OF CARE SCREENING TOOL     IDENTIFICATION  Patient Name: Jamie Parsons. Birthdate: May 30, 1941 Sex: male Admission Date (Current Location): 02/11/2021  [place] and [place] Number:  Chiropodist and Address:  [facility], [address] [place], [place] [address]      Provider Number: [id number]  Attending Physician Name and Address:  No att. providers found  Relative Name and Phone Number:  Ayham, Word (Spouse) [phone]    Current Level of Care: Hospital Recommended Level of Care: Skilled Nursing Facility Prior Approval Number:    Date Approved/Denied:   PASRR Number: [id number]  Discharge Plan: SNF    Current Diagnoses: Patient Active Problem List   Diagnosis Date Noted  . Acute respiratory failure (HCC) 07/06/2018  . Chest pain 05/08/2018  . UTI (urinary tract infection) 12/31/2017  . Adjustment disorder with depressed mood 09/15/2015  . History of major depression 09/15/2015  . History of alcohol abuse 09/15/2015    Orientation RESPIRATION BLADDER Height & Weight     Self,Time,Situation,Place  Normal Continent Weight: 139 lb 15.9 oz (63.5 kg) Height:  6\' 3"  (190.5 cm)  BEHAVIORAL SYMPTOMS/MOOD NEUROLOGICAL BOWEL NUTRITION STATUS      Continent Diet  AMBULATORY STATUS COMMUNICATION OF NEEDS Skin   Limited Assist Verbally Normal,Other (Comment) (Some marks to left arm and elbow due to fall.)                       Personal Care Assistance Level of Assistance  Bathing,Feeding,Dressing,Total care Bathing Assistance: Limited assistance Feeding assistance: Independent Dressing Assistance: Limited assistance Total Care Assistance: Limited assistance   Functional Limitations Info  Sight,Hearing,Speech Sight Info: Adequate Hearing Info: Adequate Speech Info: Adequate    SPECIAL CARE FACTORS FREQUENCY  PT (By licensed PT),OT (By licensed OT)     PT Frequency: 5 times  weekly OT Frequency: 5 times weekly            Contractures Contractures Info: Not present    Additional Factors Info  Code Status,Allergies Code Status Info: Prior DNR Allergies Info: Penicillins           Current Medications (02/11/2021):  This is the current hospital active medication list Current Facility-Administered Medications  Medication Dose Route Frequency Provider Last Rate Last Admin  . acetaminophen (TYLENOL) tablet 325-650 mg  325-650 mg Oral Q6H PRN 04/13/2021, MD      . albuterol (VENTOLIN HFA) 108 (90 Base) MCG/ACT inhaler 2 puff  2 puff Inhalation Q6H PRN Gilles Chiquito, MD      . clopidogrel (PLAVIX) tablet 75 mg  75 mg Oral Daily Gilles Chiquito, MD   75 mg at 02/11/21 1515  . donepezil (ARICEPT) tablet 5 mg  5 mg Oral QHS 04/13/21, MD      . gabapentin (NEURONTIN) capsule 100 mg  100 mg Oral BID Gilles Chiquito, MD   100 mg at 02/11/21 1559  . multivitamin with minerals tablet 1 tablet  1 tablet Oral Daily 04/13/21, MD   1 tablet at 02/11/21 1600  . phenytoin (DILANTIN) ER capsule 100 mg  100 mg Oral TID 04/13/21, MD   100 mg at 02/11/21 1515  . sertraline (ZOLOFT) tablet 50 mg  50 mg Oral QHS 04/13/21, MD      . tamsulosin [facility]) capsule 0.4 mg  0.4 mg Oral Daily [facility], MD   0.4 mg  at 02/11/21 1600   Current Outpatient Medications  Medication Sig Dispense Refill  . acetaminophen (TYLENOL) 325 MG tablet Take 325-650 mg by mouth every 6 (six) hours as needed for mild pain or headache.    Marland Kitchen amitriptyline (ELAVIL) 50 MG tablet Take 50-100 mg by mouth See admin instructions. Takes 50 mg in the evening, and 100 mg at bedtime    . clopidogrel (PLAVIX) 75 MG tablet Take 75 mg by mouth daily.    Marland Kitchen donepezil (ARICEPT) 5 MG tablet Take 5 mg by mouth at bedtime.    . gabapentin (NEURONTIN) 100 MG capsule Take 100 mg by mouth 2 (two) times daily.    . Multiple Vitamin (MULTIVITAMIN WITH MINERALS) TABS tablet Take 1  tablet by mouth daily.    . phenytoin (DILANTIN) 100 MG ER capsule Take 100 mg by mouth 3 (three) times daily.     . sertraline (ZOLOFT) 50 MG tablet Take 50 mg by mouth at bedtime.    . tamsulosin (FLOMAX) 0.4 MG CAPS capsule Take 0.4 mg by mouth daily.       Discharge Medications: Please see discharge summary for a list of discharge medications.  Relevant Imaging Results:  Relevant Lab Results:   Additional Information SSN: [id number], [place]

## 2021-02-11 NOTE — TOC Initial Note (Addendum)
Transition of Care [facility]) - Initial/Assessment Note    Patient Details  Name: Jamie Parsons. MRN: [id number] Date of Birth: [birth year]  Transition of Care [facility]) CM/SW Contact:    Villa Herb, LCSWA Phone Number: 02/11/2021, 7:09 PM  Clinical Narrative:                 [facility] consulted for possible SNF placement. CSW spoke with pts wife Jamie Parsons about OT recommending SNF. She is agreeable to SNF for pt but would like for CSW to reach out to pt about it before sending referral out. CSW spoke with pts RN to confirm pt was alert and oriented. CSW spoke with pt about SNF recommendation and educated pt on how the referral process works. Pt is understanding and agreeable to referral being sent out. Pts PASRR is [phone] A. TOC to follow.   Expected Discharge Plan: Skilled Nursing Facility Barriers to Discharge: Continued Medical Work up,SNF Pending bed offer   Patient Goals and CMS Choice Patient states their goals for this hospitalization and ongoing recovery are:: Go to SNF CMS Medicare.gov Compare Post Acute Care list provided to:: Patient Choice offered to / list presented to : [facility]  Expected Discharge Plan and Services Expected Discharge Plan: Skilled Nursing Facility In-house Referral: Clinical Social Work Discharge Planning Services: CM Consult Post Acute Care Choice: Skilled Nursing Facility Living arrangements for the past 2 months: Apartment                 DME Arranged: N/A DME Agency: NA       HH Arranged: NA HH Agency: NA        Prior Living Arrangements/Services Living arrangements for the past 2 months: Apartment Lives with:: Spouse Patient language and need for interpreter reviewed:: Yes Do you feel safe going back to the place where you live?: Yes      Need for Family Participation in Patient Care: Yes (Comment) Care giver support system in place?: Yes (comment)   Criminal Activity/Legal Involvement Pertinent to Current  Situation/Hospitalization: No - Comment as needed  Activities of Daily Living      Permission Sought/Granted                  Emotional Assessment Appearance:: Appears stated age Attitude/Demeanor/Rapport: Engaged Affect (typically observed): Accepting Orientation: : Oriented to Self,Oriented to Place,Oriented to  Time,Oriented to Situation Alcohol / Substance Use: Not Applicable Psych Involvement: No (comment)  Admission diagnosis:  fall EMS Patient Active Problem List   Diagnosis Date Noted  . Acute respiratory failure (HCC) 07/06/2018  . Chest pain 05/08/2018  . UTI (urinary tract infection) 12/31/2017  . Adjustment disorder with depressed mood 09/15/2015  . History of major depression 09/15/2015  . History of alcohol abuse 09/15/2015   PCP:  [facility]:   [facility] [place] - [address] [place] [address] Phone: [phone] Fax: [phone]     Social Determinants of Health (SDOH) Interventions    Readmission Risk Interventions No flowsheet data found.

## 2021-02-12 DIAGNOSIS — S41112A Laceration without foreign body of left upper arm, initial encounter: Secondary | ICD-10-CM | POA: Diagnosis not present

## 2021-02-12 DIAGNOSIS — F0390 Unspecified dementia without behavioral disturbance: Secondary | ICD-10-CM | POA: Diagnosis not present

## 2021-02-12 NOTE — TOC Progression Note (Addendum)
Transition of Care [facility]) - Progression Note    Patient Details  Name: Quantay Zaremba. MRN: [id number] Date of Birth: 1940-12-28  [facility]) CM/SW Contact  Larwance Rote, LCSW Phone Number: 02/12/2021, 10:49 AM  Clinical Narrative:   TOC CM/SW updated patient's wife on bed offers, [facility] made bed offer.  Patient and his wife agreeable to bed offer made by [facility].    Expected Discharge Plan: Skilled Nursing Facility Barriers to Discharge: Continued Medical Work up,SNF Pending bed offer  Expected Discharge Plan and Services Expected Discharge Plan: Skilled Nursing Facility In-house Referral: Clinical Social Work Discharge Planning Services: CM Consult Post Acute Care Choice: Skilled Nursing Facility Living arrangements for the past 2 months: Apartment                 DME Arranged: N/A DME Agency: NA       HH Arranged: NA HH Agency: NA         Social Determinants of Health (SDOH) Interventions    Readmission Risk Interventions No flowsheet data found.

## 2021-02-12 NOTE — Evaluation (Signed)
Physical Therapy Evaluation Patient Details Name: Jamie Parsons. MRN: [id number] DOB: July 12, 1941 Today's Date: 02/12/2021   History of Present Illness  Pt is a pleasant 80 y.o. male with a past medical history of COPD, HTN, HDL, chronic tobacco use, seizure disorder, CVA and dementia, not oriented at baseline per EMS who presents via EMS from home for assessment of several falls over the last couple of days. He endorses some mild pain in his left upper arm but no other acute pain.  He is not sure if he has been sick with any other acute sick symptoms lately. Imaging negative for acute fracture aside from elbow imaging indicating "tiny osseous fragment at the ventral margin of the ulnar trochlea, which could represent a tiny avulsion fragment."  Clinical Impression  Pt is a pleasant 80 year old male who was admitted for multiple falls. History of vascular dementia, however alert and oriented at this time. Pt performs bed mobility with cga, transfers with mod assist, and ambulation with min assist and RW. Very unsteady during ambulation with narrow BOS and difficulty sequencing gait. Pt demonstrates deficits with strength/mobility/balance. Seems unaware of deficits demonstrating poor safety/judgement. Reports majority of falls occur when he isn't using his rollater. Would benefit from skilled PT to address above deficits and promote optimal return to PLOF; recommend transition to STR upon discharge from acute hospitalization.  Orthostatics obtained: No reported dizziness in any position Supine: 145/78 Seated: 139/77 Standing: 118/74     Follow Up Recommendations SNF    Equipment Recommendations  Rolling walker with 5" wheels    Recommendations for Other Services       Precautions / Restrictions Precautions Precautions: Fall Restrictions Weight Bearing Restrictions: No      Mobility  Bed Mobility Overal bed mobility: Needs Assistance Bed Mobility: Supine to Sit;Sit to  Supine     Supine to sit: Min guard Sit to supine: Min guard   General bed mobility comments: slow movement over to EOB, once seated, able to demonstrate upright posture.    Transfers Overall transfer level: Needs assistance Equipment used: Rolling walker (2 wheeled) Transfers: Sit to/from Stand Sit to Stand: Mod assist         General transfer comment: cues for hand placement. Takes several attempts to stand from low bed. Once standing, slight post leaning noted. Increased sway  Ambulation/Gait Ambulation/Gait assistance: Min assist Gait Distance (Feet): 30 Feet Assistive device: Rolling walker (2 wheeled) Gait Pattern/deviations: Step-to pattern     General Gait Details: ambulated with narrow BOS with cross over midline occasionally. RW used, however still required min assist to maintain balance. Fatigues quickly with short distance. No reports of dizziness despite drop in BP.  Stairs            Wheelchair Mobility    Modified Rankin (Stroke Patients Only)       Balance Overall balance assessment: Needs assistance;History of Falls Sitting-balance support: No upper extremity supported;Feet supported Sitting balance-Leahy Scale: Good     Standing balance support: Bilateral upper extremity supported Standing balance-Leahy Scale: Poor Standing balance comment: needs B UE support on RW, post lean                             Pertinent Vitals/Pain Pain Assessment: No/denies pain    Home Living Family/patient expects to be discharged to:: Private residence Living Arrangements: Spouse/significant other Available Help at Discharge: Family;Neighbor Type of Home: Apartment (handicap) Home Access: Level  entry     Home Layout: One level Home Equipment: Walker - 4 wheels;Tub bench;Wheelchair - manual      Prior Function Level of Independence: Needs assistance   Gait / Transfers Assistance Needed: Pt reports using 4WW for mobility but endorses  sometimes leaving it behind  ADL's / Homemaking Assistance Needed: Pt reports indep with seated shower, dressing, and he and his wife do medications together using a pill box; pt reports they pay a neighbor to provide transportation, help with groceries, cleaning, and some meal prep  Comments: reports multiple falls when he stands up. Also reports wife having falls history.     Hand Dominance   Dominant Hand: Right    Extremity/Trunk Assessment   Upper Extremity Assessment Upper Extremity Assessment: Overall WFL for tasks assessed    Lower Extremity Assessment Lower Extremity Assessment: Generalized weakness (B LE grossly 3+/5)       Communication   Communication: No difficulties  Cognition Arousal/Alertness: Awake/alert Behavior During Therapy: WFL for tasks assessed/performed Overall Cognitive Status: Within Functional Limits for tasks assessed                                 General Comments: alert and oriented, follows commands well, questionable memory      General Comments      Exercises Other Exercises Other Exercises: Seated ther-ex performed including SLR, alt. marching, LAQ and B UE shoulder flexion. All ther-ex performed 5 reps with supervision and quick fatigue especially with lower body movements.   Assessment/Plan    PT Assessment Patient needs continued PT services  PT Problem List Decreased strength;Decreased activity tolerance;Decreased balance;Decreased mobility;Decreased safety awareness       PT Treatment Interventions Gait training;DME instruction;Therapeutic exercise;Balance training    PT Goals (Current goals can be found in the Care Plan section)  Acute Rehab PT Goals Patient Stated Goal: go home with my wife PT Goal Formulation: With patient Time For Goal Achievement: 02/26/21 Potential to Achieve Goals: Good    Frequency Min 2X/week   Barriers to discharge        Co-evaluation               AM-PAC PT "6  Clicks" Mobility  Outcome Measure Help needed turning from your back to your side while in a flat bed without using bedrails?: None Help needed moving from lying on your back to sitting on the side of a flat bed without using bedrails?: A Little Help needed moving to and from a bed to a chair (including a wheelchair)?: A Little Help needed standing up from a chair using your arms (e.g., wheelchair or bedside chair)?: A Little Help needed to walk in hospital room?: A Lot Help needed climbing 3-5 steps with a railing? : A Lot 6 Click Score: 17    End of Session Equipment Utilized During Treatment: Gait belt Activity Tolerance: Patient tolerated treatment well Patient left: in bed;with bed alarm set Nurse Communication: Mobility status PT Visit Diagnosis: Unsteadiness on feet (R26.81);Repeated falls (R29.6);Muscle weakness (generalized) (M62.81);History of falling (Z91.81);Difficulty in walking, not elsewhere classified (R26.2)    Time: 8101-7510 PT Time Calculation (min) (ACUTE ONLY): 23 min   Charges:   PT Evaluation $PT Eval Low Complexity: 1 Low PT Treatments $Therapeutic Exercise: 8-22 mins        Elizabeth Palau, PT, DPT [phone]   Ray,Stephanie 02/12/2021, 2:37 PM

## 2021-02-12 NOTE — ED Notes (Signed)
Pt ambulated to bathroom with RN using assistance with a walker.  Pt tolerated well, still somewhat unsteady on his feet.

## 2021-02-13 NOTE — ED Notes (Signed)
This RN to bedside, introduced self to patient. Pt resting in bed with NAD noted. Pt denies any needs at this time. VSS at this time.

## 2021-02-13 NOTE — ED Notes (Addendum)
Pt assisted to toilet in room with walker, pt verbalized to pull the cord when complete

## 2021-02-13 NOTE — ED Notes (Signed)
Pt visualized in NAD at this time, resting in bed with eyes closed. Pt provided with meal tray at this time.

## 2021-02-13 NOTE — ED Notes (Signed)
Patient ambulated to and from restroom with walker, patient very weak while ambulating, gait belt would be beneficial for patient in future, patient provided with clean pants, A&OX3 at this time.

## 2021-02-13 NOTE — ED Notes (Signed)
Pt currently sitting up in bed speaking on phone with wife.

## 2021-02-13 NOTE — ED Notes (Signed)
Patient talking with wife on phone.

## 2021-02-13 NOTE — ED Notes (Signed)
Patient ambulated to and from restroom with walker, gait remains unsteady

## 2021-02-13 NOTE — ED Notes (Signed)
Patient ambulated to and from restroom with walker again, patient remains unsteady gait.

## 2021-02-13 NOTE — ED Notes (Addendum)
Pt in room with hospital bed, pt is alert, per reports needs assist standing, tv on to find news for pt - assist with remote, ice water provided  Pt left upper arm bandaged

## 2021-02-13 NOTE — ED Notes (Signed)
Patient resting on stretcher, in and out of sleep, calm and cooperative, friendly and smiling/talking with staff.

## 2021-02-14 DIAGNOSIS — S41112A Laceration without foreign body of left upper arm, initial encounter: Secondary | ICD-10-CM | POA: Diagnosis not present

## 2021-02-14 DIAGNOSIS — F0390 Unspecified dementia without behavioral disturbance: Secondary | ICD-10-CM | POA: Diagnosis not present

## 2021-02-14 NOTE — ED Notes (Signed)
Pt sleeping. 

## 2021-02-14 NOTE — ED Notes (Signed)
Patient is talking to wife on the phone.

## 2021-02-14 NOTE — ED Provider Notes (Signed)
Emergency Medicine Observation Re-evaluation Note  Jamie Parsons. is a 80 y.o. male, seen on rounds today.  Pt initially presented to the ED for complaints of Fall Currently, the patient is resting comfortably and has no acute complaints.  Physical Exam  BP 139/76 (BP Location: Right Arm)   Pulse (!) 107   Temp 98.2 F (36.8 C) (Oral)   Resp 20   Ht 6\' 3"  (1.905 m)   Wt 63.5 kg   SpO2 96%   BMI 17.50 kg/m  Physical Exam General: Alert, comfortable appearing. Cardiac: Good peripheral perfusion. Lungs: Normal respiratory effort. Psych: Calm and cooperative.  ED Course / MDM   I have reviewed the labs performed to date as well as medications administered while in observation.  There have been no notable events or status changes in the last 24 hours.  Plan  Current plan is for SNF placement per social work. Patient is not under IVC at this time.   , MD 02/14/21 2302

## 2021-02-14 NOTE — ED Notes (Signed)
Pt up to bathroom with assistance and walker. 

## 2021-02-14 NOTE — ED Notes (Signed)
meds given

## 2021-02-14 NOTE — ED Notes (Signed)
Per social worker's text, insurance authorization can take 24-72 hours. Patient was informed by this RN of that. Patient was assisted to use phone to call his wife.

## 2021-02-14 NOTE — ED Notes (Signed)
Pt to go to [facility] on 3.15.2022 per [place]

## 2021-02-14 NOTE — ED Notes (Signed)
Patient asked to speak to the MD. MD aware.

## 2021-02-14 NOTE — ED Notes (Signed)
Pt to toilet and able to stand and pull up pants without assist; returned to bed

## 2021-02-14 NOTE — ED Notes (Signed)
Patient ambulated to the room commode independently with his walker. Patient called for assistance to stand. Patient was incontinent to urine. New wine scrub pants and ED briefs were placed on the patient. After standing, patient was able to independently ambulate with walker to the bed and reposition self on the bed. Patient was given coffee per request and re-oriented to use of the call bell.

## 2021-02-14 NOTE — ED Notes (Signed)
Patient was informed of plan for discharge. Room was darkened and patient repositioned to have a nap.

## 2021-02-14 NOTE — TOC Progression Note (Signed)
Transition of Care [facility]) - Progression Note    Patient Details  Name: Audi Wettstein. MRN: [id number] Date of Birth: 1941-11-29  Transition of Care [facility]) CM/SW Contact  Marina Goodell Phone Number:  [phone] 02/14/2021, 3:17 PM  Clinical Narrative:     CSW spoke with Junious Dresser at [facility] w/ authorization for SNF placement.  Auth# [id number], w/ initial 7 days approval. CSW contacted Jonnatan, [place] (Spouse)  [phone] with updated.  CSW updated EDP/ED Staff and requested a rapid COVID test.  CSW contacted Tanya at [facility] and confirmed the patient can be admitted after 5:00PM today once the negative COVID test results are received. CSW received confirmation the COVID test order had been placed by EDP.  Expected Discharge Plan: Skilled Nursing Facility Barriers to Discharge: Continued Medical Work up,SNF Pending bed offer  Expected Discharge Plan and Services Expected Discharge Plan: Skilled Nursing Facility In-house Referral: Clinical Social Work Discharge Planning Services: CM Consult Post Acute Care Choice: Skilled Nursing Facility Living arrangements for the past 2 months: Apartment                 DME Arranged: N/A DME Agency: NA       HH Arranged: NA HH Agency: NA         Social Determinants of Health (SDOH) Interventions    Readmission Risk Interventions No flowsheet data found.

## 2021-02-14 NOTE — ED Notes (Signed)
Pt back to bed att, pt fairly steady with walker, tv off at request, lights on

## 2021-02-14 NOTE — ED Notes (Addendum)
Per social work text, Patient will go to [facility] tomorrow.

## 2021-02-14 NOTE — ED Notes (Signed)
Patient given lunch tray. Patient was assisted in opening juice and condiments.

## 2021-02-14 NOTE — ED Notes (Signed)
Patient's wife called and stated that the patient called her irritated that he couldn't open the items on the breakfast tray and stated that he wanted to go home. Wife was referred to the social worker for an update. This Clinical research associate has also requested an update.

## 2021-02-14 NOTE — ED Notes (Signed)
Call bell answered: Pt assisted with walker to toilet, urine only, returned to bed lights dimmed and tv off, pt requested music and volume on computer too low to be heard

## 2021-02-14 NOTE — ED Notes (Signed)
Pt talking on phone

## 2021-02-14 NOTE — ED Notes (Signed)
Report given to Amy C. RN

## 2021-02-14 NOTE — Progress Notes (Addendum)
Physical Therapy Treatment Patient Details Name: Jamie Parsons. MRN: [id number] DOB: [birth year] Today's Date: 02/14/2021    History of Present Illness Pt is a pleasant 80 y.o. male with a past medical history of COPD, HTN, HDL, chronic tobacco use, seizure disorder, CVA and dementia, not oriented at baseline per EMS who presents via EMS from home for assessment of several falls over the last couple of days. He endorses some mild pain in his left upper arm but no other acute pain.  He is not sure if he has been sick with any other acute sick symptoms lately. Imaging negative for acute fracture aside from elbow imaging indicating "tiny osseous fragment at the ventral margin of the ulnar trochlea, which could represent a tiny avulsion fragment."    PT Comments    Pt is making good progress towards goals with increased ambulation distance this date with slightly improved gait mechanics. Good endurance with there-ex. Improved cognition. Performed 5 time sit<>Stand in 26 seconds demonstrating decreased power. Will continue to progress as able.   Follow Up Recommendations  SNF     Equipment Recommendations  Rolling walker with 5" wheels    Recommendations for Other Services       Precautions / Restrictions Precautions Precautions: Fall Restrictions Weight Bearing Restrictions: No    Mobility  Bed Mobility Overal bed mobility: Needs Assistance Bed Mobility: Supine to Sit;Sit to Supine     Supine to sit: Min guard Sit to supine: Min guard   General bed mobility comments: slow movement over to EOB, however once seated, able to sit with supervision for balance with kyphotic posture.    Transfers Overall transfer level: Needs assistance Equipment used: Rolling walker (2 wheeled) Transfers: Sit to/from Stand Sit to Stand: Mod assist         General transfer comment: slow technique with effortful movement. Use of RW used  Ambulation/Gait Ambulation/Gait assistance: Min  Chemical engineer (Feet): 40 Feet Assistive device: Rolling walker (2 wheeled) Gait Pattern/deviations: Step-to pattern     General Gait Details: ambulated around room with improved BOS, however still with unsteady gait and slow speed. Keeps B knees flexed.   Stairs             Wheelchair Mobility    Modified Rankin (Stroke Patients Only)       Balance Overall balance assessment: Needs assistance;History of Falls Sitting-balance support: No upper extremity supported;Feet supported Sitting balance-Leahy Scale: Good     Standing balance support: Bilateral upper extremity supported Standing balance-Leahy Scale: Fair Standing balance comment: needs B UE support on RW, post lean                            Cognition Arousal/Alertness: Awake/alert Behavior During Therapy: WFL for tasks assessed/performed Overall Cognitive Status: Within Functional Limits for tasks assessed                                 General Comments: pleasant      Exercises Other Exercises Other Exercises: Seated ther-ex performed x 10 reps including alt. marching, LAQ, B UE shoulder flexion, and scap squeezes.    General Comments        Pertinent Vitals/Pain Pain Assessment: No/denies pain    Home Living                      Prior Function  PT Goals (current goals can now be found in the care plan section) Acute Rehab PT Goals Patient Stated Goal: go home with my wife PT Goal Formulation: With patient Time For Goal Achievement: 02/26/21 Potential to Achieve Goals: Good Progress towards PT goals: Progressing toward goals    Frequency    Min 2X/week      PT Plan Current plan remains appropriate    Co-evaluation              AM-PAC PT "6 Clicks" Mobility   Outcome Measure  Help needed turning from your back to your side while in a flat bed without using bedrails?: None Help needed moving from lying on your back to  sitting on the side of a flat bed without using bedrails?: A Little Help needed moving to and from a bed to a chair (including a wheelchair)?: A Little Help needed standing up from a chair using your arms (e.g., wheelchair or bedside chair)?: A Little Help needed to walk in hospital room?: A Lot Help needed climbing 3-5 steps with a railing? : A Lot 6 Click Score: 17    End of Session Equipment Utilized During Treatment: Gait belt Activity Tolerance: Patient tolerated treatment well Patient left: in bed;with bed alarm set Nurse Communication: Mobility status PT Visit Diagnosis: Unsteadiness on feet (R26.81);Repeated falls (R29.6);Muscle weakness (generalized) (M62.81);History of falling (Z91.81);Difficulty in walking, not elsewhere classified (R26.2)     Time: 6761-9509 PT Time Calculation (min) (ACUTE ONLY): 23 min  Charges:  $Gait Training: 8-22 mins $Therapeutic Exercise: 8-22 mins                     Elizabeth Palau, PT, DPT [phone]    Ray,Stephanie 02/14/2021, 4:06 PM

## 2021-02-14 NOTE — ED Notes (Signed)
Er md in with pt

## 2021-02-14 NOTE — ED Notes (Signed)
Pt up to bathroom with assistance of walker.  Pt now speaking to wife on phone

## 2021-02-14 NOTE — ED Notes (Signed)
Patient ate a few bits of pancakes and sausage. Patient was irritated, stating he couldn't open the syrup or juices himself. Patient refused to help by this writer at this time. Patient requested to call his wife.

## 2021-02-15 DIAGNOSIS — F1021 Alcohol dependence, in remission: Secondary | ICD-10-CM | POA: Diagnosis not present

## 2021-02-15 DIAGNOSIS — S41112D Laceration without foreign body of left upper arm, subsequent encounter: Secondary | ICD-10-CM | POA: Diagnosis not present

## 2021-02-15 DIAGNOSIS — Z743 Need for continuous supervision: Secondary | ICD-10-CM | POA: Diagnosis not present

## 2021-02-15 DIAGNOSIS — N401 Enlarged prostate with lower urinary tract symptoms: Secondary | ICD-10-CM | POA: Diagnosis not present

## 2021-02-15 DIAGNOSIS — S51012D Laceration without foreign body of left elbow, subsequent encounter: Secondary | ICD-10-CM | POA: Diagnosis not present

## 2021-02-15 DIAGNOSIS — R278 Other lack of coordination: Secondary | ICD-10-CM | POA: Diagnosis not present

## 2021-02-15 DIAGNOSIS — K219 Gastro-esophageal reflux disease without esophagitis: Secondary | ICD-10-CM | POA: Diagnosis not present

## 2021-02-15 DIAGNOSIS — W19XXXD Unspecified fall, subsequent encounter: Secondary | ICD-10-CM | POA: Diagnosis not present

## 2021-02-15 DIAGNOSIS — F339 Major depressive disorder, recurrent, unspecified: Secondary | ICD-10-CM | POA: Diagnosis not present

## 2021-02-15 DIAGNOSIS — W19XXXA Unspecified fall, initial encounter: Secondary | ICD-10-CM | POA: Diagnosis not present

## 2021-02-15 DIAGNOSIS — R296 Repeated falls: Secondary | ICD-10-CM | POA: Diagnosis not present

## 2021-02-15 DIAGNOSIS — J449 Chronic obstructive pulmonary disease, unspecified: Secondary | ICD-10-CM | POA: Diagnosis not present

## 2021-02-15 DIAGNOSIS — F0390 Unspecified dementia without behavioral disturbance: Secondary | ICD-10-CM | POA: Diagnosis not present

## 2021-02-15 DIAGNOSIS — R5381 Other malaise: Secondary | ICD-10-CM | POA: Diagnosis not present

## 2021-02-15 DIAGNOSIS — I6781 Acute cerebrovascular insufficiency: Secondary | ICD-10-CM | POA: Diagnosis not present

## 2021-02-15 DIAGNOSIS — Z1159 Encounter for screening for other viral diseases: Secondary | ICD-10-CM | POA: Diagnosis not present

## 2021-02-15 DIAGNOSIS — F4321 Adjustment disorder with depressed mood: Secondary | ICD-10-CM | POA: Diagnosis not present

## 2021-02-15 DIAGNOSIS — Z9181 History of falling: Secondary | ICD-10-CM | POA: Diagnosis not present

## 2021-02-15 DIAGNOSIS — R279 Unspecified lack of coordination: Secondary | ICD-10-CM | POA: Diagnosis not present

## 2021-02-15 DIAGNOSIS — E785 Hyperlipidemia, unspecified: Secondary | ICD-10-CM | POA: Diagnosis not present

## 2021-02-15 DIAGNOSIS — J9600 Acute respiratory failure, unspecified whether with hypoxia or hypercapnia: Secondary | ICD-10-CM | POA: Diagnosis not present

## 2021-02-15 DIAGNOSIS — S41112A Laceration without foreign body of left upper arm, initial encounter: Secondary | ICD-10-CM | POA: Diagnosis not present

## 2021-02-15 DIAGNOSIS — M6281 Muscle weakness (generalized): Secondary | ICD-10-CM | POA: Diagnosis not present

## 2021-02-15 DIAGNOSIS — G4089 Other seizures: Secondary | ICD-10-CM | POA: Diagnosis not present

## 2021-02-15 LAB — SARS CORONAVIRUS 2 (TAT 6-24 HRS): SARS Coronavirus 2: NEGATIVE

## 2021-02-15 NOTE — ED Notes (Signed)
Pt up to bathroom with assistance and walker.

## 2021-02-15 NOTE — ED Notes (Signed)
Ems here to transport pt to [facility].  Pt alert.

## 2021-02-15 NOTE — ED Notes (Signed)
Lunch tray served.

## 2021-02-15 NOTE — ED Notes (Signed)
Resumed care from [facility].  Pt alert.  Pt waiting on transport to [facility].

## 2021-02-15 NOTE — TOC Progression Note (Signed)
Transition of Care [facility]) - Progression Note    Patient Details  Name: Jamie Parsons. MRN: [id number] Date of Birth: 1941/11/26  Transition [facility]) CM/SW Contact  Joseph Art, [place] Phone Number: 02/15/2021, 3:52 PM  Clinical Narrative:     Pending EMS transport to [facility]. CSW confirmed with Ed Secretary patient is still on EMS pick -up list.  Expected Discharge Plan: Skilled Nursing Facility Barriers to Discharge: Continued Medical Work up,SNF Pending bed offer  Expected Discharge Plan and Services Expected Discharge Plan: Skilled Nursing Facility In-house Referral: Clinical Social Work Discharge Planning Services: CM Consult Post Acute Care Choice: Skilled Nursing Facility Living arrangements for the past 2 months: Apartment                 DME Arranged: N/A DME Agency: NA       HH Arranged: NA HH Agency: NA         Social Determinants of Health (SDOH) Interventions    Readmission Risk Interventions No flowsheet data found.

## 2021-02-15 NOTE — ED Provider Notes (Signed)
-----------------------------------------   5:43 AM on 02/15/2021 -----------------------------------------   Blood pressure 139/76, pulse (!) 107, temperature 98.2 F (36.8 C), temperature source Oral, resp. rate 20, height 6\' 3"  (1.905 m), weight 63.5 kg, SpO2 96 %.  The patient is calm and cooperative at this time.  There have been no acute events since the last update.  Awaiting disposition plan from Social Work team.   , MD 02/15/21 [phone]

## 2021-02-15 NOTE — TOC Transition Note (Signed)
Transition of Care [facility]) - CM/SW Discharge Note   Patient Details  Name: Jamie Parsons. MRN: [id number] Date of Birth: 05/10/1941  Transition of Care [facility]) CM/SW Contact:  Marina Goodell Phone Number: [phone] 02/15/2021, 8:42 AM   Clinical Narrative:     Patient will be discharging to [facility] Room# 22B, Report # [phone]. EDP/ED Staff updated.  Patient's Paydon, Carll (Spouse)  [phone] updated.     Final next level of care: Skilled Nursing Facility Barriers to Discharge: Continued Medical Work up,SNF Pending bed offer   Patient Goals and CMS Choice Patient states their goals for this hospitalization and ongoing recovery are:: Go to SNF CMS Medicare.gov Compare Post Acute Care list provided to:: Patient Choice offered to / list presented to : [facility]  Discharge Placement                       Discharge Plan and Services In-house Referral: Clinical Social Work Discharge Planning Services: CM Consult Post Acute Care Choice: Skilled Nursing Facility          DME Arranged: N/A DME Agency: NA       HH Arranged: NA HH Agency: NA        Social Determinants of Health (SDOH) Interventions     Readmission Risk Interventions No flowsheet data found.

## 2021-02-15 NOTE — ED Notes (Signed)
Pt sleeping. 

## 2021-02-15 NOTE — ED Notes (Signed)
[facility] called for transport to [facility]

## 2021-02-15 NOTE — ED Notes (Signed)
meds given  Pt in hallway bed.  Pt sleepy.

## 2021-03-07 DIAGNOSIS — G4089 Other seizures: Secondary | ICD-10-CM | POA: Diagnosis not present

## 2021-03-07 DIAGNOSIS — F0390 Unspecified dementia without behavioral disturbance: Secondary | ICD-10-CM | POA: Diagnosis not present

## 2021-03-07 DIAGNOSIS — I6781 Acute cerebrovascular insufficiency: Secondary | ICD-10-CM | POA: Diagnosis not present

## 2021-03-07 DIAGNOSIS — K219 Gastro-esophageal reflux disease without esophagitis: Secondary | ICD-10-CM | POA: Diagnosis not present

## 2021-03-10 DIAGNOSIS — G40909 Epilepsy, unspecified, not intractable, without status epilepticus: Secondary | ICD-10-CM | POA: Diagnosis not present

## 2021-03-10 DIAGNOSIS — I7300 Raynaud's syndrome without gangrene: Secondary | ICD-10-CM | POA: Diagnosis not present

## 2021-03-10 DIAGNOSIS — R911 Solitary pulmonary nodule: Secondary | ICD-10-CM | POA: Diagnosis not present

## 2021-03-10 DIAGNOSIS — Z0000 Encounter for general adult medical examination without abnormal findings: Secondary | ICD-10-CM | POA: Diagnosis not present

## 2021-03-10 DIAGNOSIS — F3341 Major depressive disorder, recurrent, in partial remission: Secondary | ICD-10-CM | POA: Diagnosis not present

## 2021-03-10 DIAGNOSIS — J418 Mixed simple and mucopurulent chronic bronchitis: Secondary | ICD-10-CM | POA: Diagnosis not present

## 2021-03-10 DIAGNOSIS — Z8781 Personal history of (healed) traumatic fracture: Secondary | ICD-10-CM | POA: Diagnosis not present

## 2021-03-10 DIAGNOSIS — E7800 Pure hypercholesterolemia, unspecified: Secondary | ICD-10-CM | POA: Diagnosis not present

## 2021-03-10 DIAGNOSIS — R350 Frequency of micturition: Secondary | ICD-10-CM | POA: Diagnosis not present

## 2021-03-10 DIAGNOSIS — F0150 Vascular dementia without behavioral disturbance: Secondary | ICD-10-CM | POA: Diagnosis not present

## 2021-03-10 DIAGNOSIS — R7302 Impaired glucose tolerance (oral): Secondary | ICD-10-CM | POA: Diagnosis not present

## 2021-03-10 DIAGNOSIS — I10 Essential (primary) hypertension: Secondary | ICD-10-CM | POA: Diagnosis not present

## 2021-03-10 DIAGNOSIS — N401 Enlarged prostate with lower urinary tract symptoms: Secondary | ICD-10-CM | POA: Diagnosis not present

## 2021-03-11 DIAGNOSIS — I10 Essential (primary) hypertension: Secondary | ICD-10-CM | POA: Diagnosis not present

## 2021-03-11 DIAGNOSIS — E785 Hyperlipidemia, unspecified: Secondary | ICD-10-CM | POA: Diagnosis not present

## 2021-03-11 DIAGNOSIS — N401 Enlarged prostate with lower urinary tract symptoms: Secondary | ICD-10-CM | POA: Diagnosis not present

## 2021-03-11 DIAGNOSIS — J9600 Acute respiratory failure, unspecified whether with hypoxia or hypercapnia: Secondary | ICD-10-CM | POA: Diagnosis not present

## 2021-03-11 DIAGNOSIS — K219 Gastro-esophageal reflux disease without esophagitis: Secondary | ICD-10-CM | POA: Diagnosis not present

## 2021-03-11 DIAGNOSIS — S069X9S Unspecified intracranial injury with loss of consciousness of unspecified duration, sequela: Secondary | ICD-10-CM | POA: Diagnosis not present

## 2021-03-11 DIAGNOSIS — E7800 Pure hypercholesterolemia, unspecified: Secondary | ICD-10-CM | POA: Diagnosis not present

## 2021-03-11 DIAGNOSIS — G40909 Epilepsy, unspecified, not intractable, without status epilepticus: Secondary | ICD-10-CM | POA: Diagnosis not present

## 2021-03-11 DIAGNOSIS — W3400XS Accidental discharge from unspecified firearms or gun, sequela: Secondary | ICD-10-CM | POA: Diagnosis not present

## 2021-03-11 DIAGNOSIS — R350 Frequency of micturition: Secondary | ICD-10-CM | POA: Diagnosis not present

## 2021-03-11 DIAGNOSIS — J441 Chronic obstructive pulmonary disease with (acute) exacerbation: Secondary | ICD-10-CM | POA: Diagnosis not present

## 2021-03-11 DIAGNOSIS — F0280 Dementia in other diseases classified elsewhere without behavioral disturbance: Secondary | ICD-10-CM | POA: Diagnosis not present

## 2021-03-11 DIAGNOSIS — I7300 Raynaud's syndrome without gangrene: Secondary | ICD-10-CM | POA: Diagnosis not present

## 2021-04-03 DIAGNOSIS — S069X9S Unspecified intracranial injury with loss of consciousness of unspecified duration, sequela: Secondary | ICD-10-CM | POA: Diagnosis not present

## 2021-04-03 DIAGNOSIS — K219 Gastro-esophageal reflux disease without esophagitis: Secondary | ICD-10-CM | POA: Diagnosis not present

## 2021-04-03 DIAGNOSIS — J9600 Acute respiratory failure, unspecified whether with hypoxia or hypercapnia: Secondary | ICD-10-CM | POA: Diagnosis not present

## 2021-04-03 DIAGNOSIS — W3400XS Accidental discharge from unspecified firearms or gun, sequela: Secondary | ICD-10-CM | POA: Diagnosis not present

## 2021-04-03 DIAGNOSIS — I10 Essential (primary) hypertension: Secondary | ICD-10-CM | POA: Diagnosis not present

## 2021-04-03 DIAGNOSIS — J441 Chronic obstructive pulmonary disease with (acute) exacerbation: Secondary | ICD-10-CM | POA: Diagnosis not present

## 2021-04-03 DIAGNOSIS — N401 Enlarged prostate with lower urinary tract symptoms: Secondary | ICD-10-CM | POA: Diagnosis not present

## 2021-04-03 DIAGNOSIS — E785 Hyperlipidemia, unspecified: Secondary | ICD-10-CM | POA: Diagnosis not present

## 2021-04-03 DIAGNOSIS — R350 Frequency of micturition: Secondary | ICD-10-CM | POA: Diagnosis not present

## 2021-04-03 DIAGNOSIS — E7800 Pure hypercholesterolemia, unspecified: Secondary | ICD-10-CM | POA: Diagnosis not present

## 2021-04-03 DIAGNOSIS — G40909 Epilepsy, unspecified, not intractable, without status epilepticus: Secondary | ICD-10-CM | POA: Diagnosis not present

## 2021-04-03 DIAGNOSIS — F0280 Dementia in other diseases classified elsewhere without behavioral disturbance: Secondary | ICD-10-CM | POA: Diagnosis not present

## 2021-04-03 DIAGNOSIS — I7300 Raynaud's syndrome without gangrene: Secondary | ICD-10-CM | POA: Diagnosis not present

## 2021-05-12 ENCOUNTER — Emergency Department
Admission: EM | Admit: 2021-05-12 | Discharge: 2021-06-03 | Disposition: E | Payer: PPO | Attending: Emergency Medicine | Admitting: Emergency Medicine

## 2021-05-12 ENCOUNTER — Emergency Department: Payer: PPO

## 2021-05-12 ENCOUNTER — Encounter: Payer: Self-pay | Admitting: Emergency Medicine

## 2021-05-12 DIAGNOSIS — R4020 Unspecified coma: Secondary | ICD-10-CM | POA: Diagnosis not present

## 2021-05-12 DIAGNOSIS — J449 Chronic obstructive pulmonary disease, unspecified: Secondary | ICD-10-CM | POA: Diagnosis not present

## 2021-05-12 DIAGNOSIS — R404 Transient alteration of awareness: Secondary | ICD-10-CM | POA: Diagnosis not present

## 2021-05-12 DIAGNOSIS — Z79899 Other long term (current) drug therapy: Secondary | ICD-10-CM | POA: Insufficient documentation

## 2021-05-12 DIAGNOSIS — I10 Essential (primary) hypertension: Secondary | ICD-10-CM | POA: Diagnosis not present

## 2021-05-12 DIAGNOSIS — J9601 Acute respiratory failure with hypoxia: Secondary | ICD-10-CM

## 2021-05-12 DIAGNOSIS — Z4682 Encounter for fitting and adjustment of non-vascular catheter: Secondary | ICD-10-CM | POA: Diagnosis not present

## 2021-05-12 DIAGNOSIS — Z7902 Long term (current) use of antithrombotics/antiplatelets: Secondary | ICD-10-CM | POA: Diagnosis not present

## 2021-05-12 DIAGNOSIS — R0902 Hypoxemia: Secondary | ICD-10-CM | POA: Diagnosis not present

## 2021-05-12 DIAGNOSIS — F17210 Nicotine dependence, cigarettes, uncomplicated: Secondary | ICD-10-CM | POA: Insufficient documentation

## 2021-05-12 DIAGNOSIS — R001 Bradycardia, unspecified: Secondary | ICD-10-CM | POA: Diagnosis not present

## 2021-05-12 DIAGNOSIS — R569 Unspecified convulsions: Secondary | ICD-10-CM

## 2021-05-12 DIAGNOSIS — I469 Cardiac arrest, cause unspecified: Secondary | ICD-10-CM | POA: Diagnosis not present

## 2021-05-12 DIAGNOSIS — I499 Cardiac arrhythmia, unspecified: Secondary | ICD-10-CM | POA: Diagnosis not present

## 2021-05-12 DIAGNOSIS — R55 Syncope and collapse: Secondary | ICD-10-CM | POA: Diagnosis not present

## 2021-05-12 LAB — BLOOD GAS, ARTERIAL
Acid-base deficit: 6.1 mmol/L — ABNORMAL HIGH (ref 0.0–2.0)
Bicarbonate: 23.8 mmol/L (ref 20.0–28.0)
FIO2: 0.6
MECHVT: 450 mL
O2 Saturation: 99.8 %
PEEP: 5 cmH2O
Patient temperature: 37
RATE: 20 resp/min
pCO2 arterial: 70 mmHg (ref 32.0–48.0)
pH, Arterial: 7.14 — CL (ref 7.350–7.450)
pO2, Arterial: 253 mmHg — ABNORMAL HIGH (ref 83.0–108.0)

## 2021-05-12 LAB — CBC WITH DIFFERENTIAL/PLATELET
Abs Immature Granulocytes: 0.07 10*3/uL (ref 0.00–0.07)
Abs Immature Granulocytes: 0.3 10*3/uL — ABNORMAL HIGH (ref 0.00–0.07)
Basophils Absolute: 0 10*3/uL (ref 0.0–0.1)
Basophils Absolute: 0.1 10*3/uL (ref 0.0–0.1)
Basophils Relative: 0 %
Basophils Relative: 0 %
Eosinophils Absolute: 0.1 10*3/uL (ref 0.0–0.5)
Eosinophils Absolute: 0.1 10*3/uL (ref 0.0–0.5)
Eosinophils Relative: 1 %
Eosinophils Relative: 1 %
HCT: 20.7 % — ABNORMAL LOW (ref 39.0–52.0)
HCT: 34.6 % — ABNORMAL LOW (ref 39.0–52.0)
Hemoglobin: 6.4 g/dL — ABNORMAL LOW (ref 13.0–17.0)
Hemoglobin: 11.3 g/dL — ABNORMAL LOW (ref 13.0–17.0)
Immature Granulocytes: 1 %
Immature Granulocytes: 2 %
Lymphocytes Relative: 15 %
Lymphocytes Relative: 7 %
Lymphs Abs: 1 10*3/uL (ref 0.7–4.0)
Lymphs Abs: 0.9 10*3/uL (ref 0.7–4.0)
MCH: 29.5 pg (ref 26.0–34.0)
MCH: 29.9 pg (ref 26.0–34.0)
MCHC: 30.9 g/dL (ref 30.0–36.0)
MCHC: 32.7 g/dL (ref 30.0–36.0)
MCV: 95.4 fL (ref 80.0–100.0)
MCV: 91.5 fL (ref 80.0–100.0)
Monocytes Absolute: 1 10*3/uL (ref 0.1–1.0)
Monocytes Absolute: 1.5 10*3/uL — ABNORMAL HIGH (ref 0.1–1.0)
Monocytes Relative: 14 %
Monocytes Relative: 11 %
Neutro Abs: 4.8 10*3/uL (ref 1.7–7.7)
Neutro Abs: 10.6 10*3/uL — ABNORMAL HIGH (ref 1.7–7.7)
Neutrophils Relative %: 69 %
Neutrophils Relative %: 79 %
Platelets: 135 10*3/uL — ABNORMAL LOW (ref 150–400)
Platelets: 214 10*3/uL (ref 150–400)
RBC: 2.17 MIL/uL — ABNORMAL LOW (ref 4.22–5.81)
RBC: 3.78 MIL/uL — ABNORMAL LOW (ref 4.22–5.81)
RDW: 13.6 % (ref 11.5–15.5)
RDW: 13.5 % (ref 11.5–15.5)
WBC: 6.9 10*3/uL (ref 4.0–10.5)
WBC: 13.4 10*3/uL — ABNORMAL HIGH (ref 4.0–10.5)
nRBC: 0 % (ref 0.0–0.2)
nRBC: 0 % (ref 0.0–0.2)

## 2021-05-12 LAB — COMPREHENSIVE METABOLIC PANEL
ALT: 25 U/L (ref 0–44)
AST: 36 U/L (ref 15–41)
Albumin: 2.9 g/dL — ABNORMAL LOW (ref 3.5–5.0)
Alkaline Phosphatase: 73 U/L (ref 38–126)
Anion gap: 11 (ref 5–15)
BUN: 18 mg/dL (ref 8–23)
CO2: 24 mmol/L (ref 22–32)
Calcium: 7.9 mg/dL — ABNORMAL LOW (ref 8.9–10.3)
Chloride: 103 mmol/L (ref 98–111)
Creatinine, Ser: 1.21 mg/dL (ref 0.61–1.24)
GFR, Estimated: 60 mL/min — ABNORMAL LOW (ref >60)
Glucose, Bld: 247 mg/dL — ABNORMAL HIGH (ref 70–99)
Potassium: 3.6 mmol/L (ref 3.5–5.1)
Sodium: 138 mmol/L (ref 135–145)
Total Bilirubin: 0.6 mg/dL (ref 0.3–1.2)
Total Protein: 6 g/dL — ABNORMAL LOW (ref 6.5–8.1)

## 2021-05-12 LAB — TYPE AND SCREEN
ABO/RH(D): O POS
Antibody Screen: NEGATIVE

## 2021-05-12 LAB — LACTIC ACID, PLASMA: Lactic Acid, Venous: 4.2 mmol/L (ref 0.5–1.9)

## 2021-05-12 LAB — TROPONIN I (HIGH SENSITIVITY): Troponin I (High Sensitivity): 20 ng/L — ABNORMAL HIGH (ref <18)

## 2021-05-12 MED ORDER — LORAZEPAM 2 MG/ML IJ SOLN
INTRAMUSCULAR | Status: AC
  Administered 2021-05-12: 1 mg
  Filled 2021-05-12: qty 1

## 2021-05-12 MED ORDER — NALOXONE HCL 2 MG/2ML IJ SOSY
PREFILLED_SYRINGE | INTRAMUSCULAR | Status: AC
  Administered 2021-05-12: 1 mg via INTRAVENOUS
  Filled 2021-05-12: qty 2

## 2021-05-12 MED ORDER — NOREPINEPHRINE BITARTRATE 1 MG/ML IV SOLN
INTRAVENOUS | Status: AC | PRN
  Administered 2021-05-12: 1 ug/kg/min via INTRAVENOUS

## 2021-05-12 MED ORDER — EPINEPHRINE 1 MG/10ML IJ SOSY
PREFILLED_SYRINGE | INTRAMUSCULAR | Status: AC | PRN
Start: 2021-05-12 — End: 2021-05-12
  Administered 2021-05-12 (×3): 1 mg via INTRAVENOUS

## 2021-05-12 MED ORDER — SODIUM CHLORIDE 0.9 % IV SOLN: 60.0000 mg/kg | Freq: Once | INTRAVENOUS | Status: DC

## 2021-05-12 MED ORDER — SODIUM CHLORIDE 0.9 % IV SOLN
60.0000 mg/kg | INTRAVENOUS | Status: AC
  Administered 2021-05-12: 3900 mg via INTRAVENOUS
  Filled 2021-05-12: qty 39

## 2021-05-12 MED ORDER — VECURONIUM BROMIDE 10 MG IV SOLR
INTRAVENOUS | Status: AC | PRN
  Administered 2021-05-12: 75 mg via INTRAVENOUS

## 2021-05-12 MED ORDER — EPINEPHRINE 1 MG/10ML IJ SOSY
PREFILLED_SYRINGE | INTRAMUSCULAR | Status: AC | PRN
  Administered 2021-05-12: 1 mg via INTRAVENOUS

## 2021-05-12 MED ORDER — NALOXONE HCL 2 MG/2ML IJ SOSY
1.0000 mg | PREFILLED_SYRINGE | Freq: Once | INTRAMUSCULAR | Status: AC
  Administered 2021-05-12: 1 mg via INTRAVENOUS

## 2021-05-12 MED ORDER — VASOPRESSIN 20 UNITS/100 ML INFUSION FOR SHOCK
0.0000 [IU]/min | INTRAVENOUS | Status: DC
  Administered 2021-05-12: 0.04 [IU]/min via INTRAVENOUS
  Filled 2021-05-12: qty 100

## 2021-05-12 MED ORDER — MORPHINE SULFATE (PF) 4 MG/ML IV SOLN
4.0000 mg | Freq: Once | INTRAVENOUS | Status: AC
  Administered 2021-05-12: 4 mg via INTRAVENOUS
  Filled 2021-05-12: qty 1

## 2021-05-12 MED ORDER — KETAMINE HCL 10 MG/ML IJ SOLN
INTRAMUSCULAR | Status: AC | PRN
  Administered 2021-05-12: 75 mg via INTRAVENOUS

## 2021-05-13 MED FILL — Medication: Qty: 1 | Status: AC

## 2021-06-03 NOTE — Progress Notes (Signed)
   06/09/2021 1520  Clinical Encounter Type  Visited With Patient not available  Visit Type Follow-up;Code  Referral From Other (Comment)  Consult/Referral To Chaplain  Recommendations continuing to follow  Spiritual Encounters  Spiritual Needs Other (Comment)  Chaplain Burris responded to a Code Blue. Mr. Ardolino was en route to CT scan when the code was called. Luna Fuse was already in ED. Luna Fuse bore witness to care and offered silent prayer. Chaplain Burris coordinated with physician to contact PT's spouse. Will continue to follow status and remain available if family arrives.

## 2021-06-03 NOTE — Progress Notes (Signed)
   2021-06-05 1615  Clinical Encounter Type  Visited With Patient and family together  Visit Type Initial;Spiritual support;Social support;Code;Patient actively dying  Referral From Nurse  Consult/Referral To Chaplain   Posey Boyer responded to a Code [employer]. PT's wife had arrived and was devastated at PT's prognosis. Mrs. Bozzi struggled with guilt, and wrestled with her faith, and the meaning of life without her husband. Chaplain stayed with PT's wife & friend of family to offer a calm intentional presence. Chaplain ministered with prayer, listening, and gave space for storytelling. She spoke of the wonderful husband he was and how they were married for 30+ years. Chaplain stayed until PT's wife said they would be leaving shortly.

## 2021-06-03 NOTE — Code Documentation (Signed)
1 mg of Ativan given for seizure like activity.

## 2021-06-03 NOTE — ED Triage Notes (Signed)
Patient arrives via [facility] unresponsive from home. Patient received 3 minutes of CPR with 1 epi from EMS.

## 2021-06-03 NOTE — ED Notes (Signed)
Lab at bedside getting blood work 

## 2021-06-03 NOTE — ED Notes (Signed)
Patient transported to CT with RT, 2RN, and Vicente Males, MD.

## 2021-06-03 NOTE — Code Documentation (Signed)
CPR started.

## 2021-06-03 NOTE — Code Documentation (Signed)
Pulse check. Pulse found.  

## 2021-06-03 NOTE — ED Notes (Signed)
Time of death called by Derrill Kay, MD [phone].

## 2021-06-03 NOTE — ED Notes (Signed)
No pulse. CPR started. Patient transported back to room 15 with CPR in progress.

## 2021-06-03 NOTE — ED Notes (Signed)
Critical lab called/confirmed 1536. md at bedside and given verbal results.

## 2021-06-03 NOTE — ED Provider Notes (Signed)
I spoke with Thedora Hinders, patient's wife. Did confirm that she is comfortable and wanting extubation. Did discuss that Jamie Parsons would likely die shortly after extubation, did try to reassure her that he would not feel any pain. She stated she would not like to be present for extubation, nor would she want to see him after he passes.    Phineas Semen, MD 2021-05-30 [phone]

## 2021-06-03 NOTE — ED Notes (Signed)
Family at bedside with patient.
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
## 2021-06-03 NOTE — ED Notes (Signed)
Family spoke with md and chose for pt to be comfort care.  Rt called for extubation and dc of medications.

## 2021-06-03 NOTE — Progress Notes (Addendum)
   2021-05-29 1400  Clinical Encounter Type  Visited With Patient not available  Visit Type Code  Referral From Chaplain  Consult/Referral To Chaplain  Recommendations Continuing to follow; waiting for family  Spiritual Encounters  Spiritual Needs Other (Comment)  Chaplain Burris responded to code STEMI. Code was cancelled but Jamie Parsons remains unresponsive. Luna Fuse provided staff support and coordination. Luna Fuse bore witness to care and provided silent prayer as well as protecting PT dignity. Will continue to monitor and check for family to arrive.

## 2021-06-03 NOTE — ED Notes (Signed)
Pulse back. CPR stopped.

## 2021-06-03 NOTE — Code Documentation (Signed)
1mg  ativan given per , MD for seizure like activity.

## 2021-06-03 DEATH — deceased
